# Patient Record
Sex: Male | Born: 1935 | Race: White | Hispanic: No | Marital: Married | Smoking: Current some day smoker
Health system: Southern US, Community
[De-identification: ages and names within clinical notes are randomized; demographics above are authoritative.]

## PROBLEM LIST (undated history)

## (undated) DIAGNOSIS — R9430 Abnormal result of cardiovascular function study, unspecified: Secondary | ICD-10-CM

## (undated) DIAGNOSIS — M545 Low back pain, unspecified: Secondary | ICD-10-CM

## (undated) DIAGNOSIS — IMO0002 Reserved for concepts with insufficient information to code with codable children: Secondary | ICD-10-CM

## (undated) DIAGNOSIS — R001 Bradycardia, unspecified: Secondary | ICD-10-CM

## (undated) DIAGNOSIS — I2510 Atherosclerotic heart disease of native coronary artery without angina pectoris: Secondary | ICD-10-CM

## (undated) DIAGNOSIS — E785 Hyperlipidemia, unspecified: Secondary | ICD-10-CM

## (undated) DIAGNOSIS — I10 Essential (primary) hypertension: Secondary | ICD-10-CM

## (undated) HISTORY — DX: Bradycardia, unspecified: R00.1

## (undated) HISTORY — DX: Low back pain, unspecified: M54.50

## (undated) HISTORY — PX: CATARACT EXTRACTION: SUR2

## (undated) HISTORY — DX: Low back pain: M54.5

## (undated) HISTORY — DX: Abnormal result of cardiovascular function study, unspecified: R94.30

## (undated) HISTORY — DX: Hyperlipidemia, unspecified: E78.5

## (undated) HISTORY — DX: Atherosclerotic heart disease of native coronary artery without angina pectoris: I25.10

## (undated) HISTORY — PX: CARDIAC CATHETERIZATION: SHX172

## (undated) HISTORY — DX: Reserved for concepts with insufficient information to code with codable children: IMO0002

## (undated) HISTORY — DX: Essential (primary) hypertension: I10

---

## 1997-12-24 ENCOUNTER — Inpatient Hospital Stay (HOSPITAL_COMMUNITY): Admission: EM | Admit: 1997-12-24 | Discharge: 1997-12-27 | Payer: Self-pay | Admitting: Cardiology

## 2001-06-15 ENCOUNTER — Encounter: Payer: Self-pay | Admitting: Internal Medicine

## 2001-06-15 ENCOUNTER — Ambulatory Visit (HOSPITAL_COMMUNITY): Admission: RE | Admit: 2001-06-15 | Discharge: 2001-06-15 | Payer: Self-pay | Admitting: Internal Medicine

## 2004-09-22 ENCOUNTER — Emergency Department (HOSPITAL_COMMUNITY): Admission: EM | Admit: 2004-09-22 | Discharge: 2004-09-22 | Payer: Self-pay | Admitting: Emergency Medicine

## 2004-10-03 ENCOUNTER — Inpatient Hospital Stay (HOSPITAL_COMMUNITY): Admission: RE | Admit: 2004-10-03 | Discharge: 2004-10-04 | Payer: Self-pay | Admitting: Neurosurgery

## 2005-04-11 ENCOUNTER — Ambulatory Visit (HOSPITAL_COMMUNITY): Admission: RE | Admit: 2005-04-11 | Discharge: 2005-04-11 | Payer: Self-pay | Admitting: Neurosurgery

## 2005-09-19 ENCOUNTER — Ambulatory Visit (HOSPITAL_COMMUNITY): Admission: RE | Admit: 2005-09-19 | Discharge: 2005-09-19 | Payer: Self-pay | Admitting: Neurosurgery

## 2005-09-25 ENCOUNTER — Ambulatory Visit (HOSPITAL_COMMUNITY): Admission: RE | Admit: 2005-09-25 | Discharge: 2005-09-25 | Payer: Self-pay | Admitting: Neurosurgery

## 2005-10-01 ENCOUNTER — Encounter: Admission: RE | Admit: 2005-10-01 | Discharge: 2005-10-01 | Payer: Self-pay | Admitting: Neurosurgery

## 2006-09-20 ENCOUNTER — Encounter: Admission: RE | Admit: 2006-09-20 | Discharge: 2006-09-20 | Payer: Self-pay | Admitting: Neurosurgery

## 2006-10-21 ENCOUNTER — Encounter (INDEPENDENT_AMBULATORY_CARE_PROVIDER_SITE_OTHER): Payer: Self-pay | Admitting: Specialist

## 2006-10-21 ENCOUNTER — Ambulatory Visit (HOSPITAL_COMMUNITY): Admission: RE | Admit: 2006-10-21 | Discharge: 2006-10-22 | Payer: Self-pay | Admitting: Ophthalmology

## 2007-07-10 ENCOUNTER — Ambulatory Visit (HOSPITAL_COMMUNITY): Admission: RE | Admit: 2007-07-10 | Discharge: 2007-07-10 | Payer: Self-pay | Admitting: Cardiothoracic Surgery

## 2007-07-17 ENCOUNTER — Ambulatory Visit: Payer: Self-pay | Admitting: Cardiothoracic Surgery

## 2008-06-17 ENCOUNTER — Ambulatory Visit (HOSPITAL_COMMUNITY): Admission: RE | Admit: 2008-06-17 | Discharge: 2008-06-17 | Payer: Self-pay | Admitting: Cardiothoracic Surgery

## 2009-05-08 ENCOUNTER — Inpatient Hospital Stay (HOSPITAL_COMMUNITY): Admission: EM | Admit: 2009-05-08 | Discharge: 2009-05-10 | Payer: Self-pay | Admitting: Internal Medicine

## 2009-05-08 ENCOUNTER — Ambulatory Visit: Payer: Self-pay | Admitting: Internal Medicine

## 2009-05-09 ENCOUNTER — Encounter: Payer: Self-pay | Admitting: Cardiology

## 2009-05-10 ENCOUNTER — Encounter: Payer: Self-pay | Admitting: Cardiology

## 2009-05-10 ENCOUNTER — Telehealth: Payer: Self-pay | Admitting: Cardiology

## 2009-05-15 ENCOUNTER — Telehealth (INDEPENDENT_AMBULATORY_CARE_PROVIDER_SITE_OTHER): Payer: Self-pay | Admitting: *Deleted

## 2009-05-15 ENCOUNTER — Encounter: Payer: Self-pay | Admitting: Cardiology

## 2009-05-15 ENCOUNTER — Encounter (INDEPENDENT_AMBULATORY_CARE_PROVIDER_SITE_OTHER): Payer: Self-pay | Admitting: *Deleted

## 2009-05-23 ENCOUNTER — Encounter: Payer: Self-pay | Admitting: Cardiology

## 2009-05-24 ENCOUNTER — Ambulatory Visit: Payer: Self-pay | Admitting: Cardiology

## 2009-08-02 ENCOUNTER — Telehealth: Payer: Self-pay | Admitting: Physician Assistant

## 2009-08-30 ENCOUNTER — Encounter: Payer: Self-pay | Admitting: Cardiology

## 2009-09-28 ENCOUNTER — Ambulatory Visit: Payer: Self-pay | Admitting: Cardiology

## 2009-10-20 ENCOUNTER — Ambulatory Visit: Payer: Self-pay | Admitting: Cardiology

## 2010-07-29 ENCOUNTER — Encounter: Payer: Self-pay | Admitting: Cardiothoracic Surgery

## 2010-07-30 ENCOUNTER — Encounter: Payer: Self-pay | Admitting: Cardiothoracic Surgery

## 2010-08-07 NOTE — Assessment & Plan Note (Signed)
Summary: 3 MO FU R/S   Visit Type:  Follow-up Primary Provider:  Royston Bake  CC:  CAD.  History of Present Illness: The patient is seen for followup of coronary artery disease.  He has hypertension.  He received drug-eluting stents in November, 2010.  Is not having any chest pain.  He does continue to have significant back pain.  He tells me that he has seen Dr.Nudelman of neurosurgery who had operated on his back in the past.  It seems that his pain is arthritic and not related to this disease.  I do not have the specifics.  The patient has been trying to take one of the medications but it does not appear to help.  Patient ran out of his blood pressure medicines and we will rewrite them today.  Preventive Screening-Counseling & Management  Alcohol-Tobacco     Smoking Status: quit  Comments: quit smoking about 6 months ago after smoking off/on since 76 yrs old  Current Medications (verified): 1)  Metoprolol Tartrate 50 Mg Tabs (Metoprolol Tartrate) .... Take 1 Tablet By Mouth Two Times A Day 2)  Alprazolam 0.25 Mg Tabs (Alprazolam) .... Take 1 Tablet By Mouth Four Times A Day Prn 3)  Lisinopril 40 Mg Tabs (Lisinopril) .... Take 1 Tablet By Mouth Once A Day 4)  Vicodin 5-500 Mg Tabs (Hydrocodone-Acetaminophen) .... As Needed 5)  Plavix 75 Mg Tabs (Clopidogrel Bisulfate) .... Take 1 Tablet By Mouth Once A Day 6)  Aspirin 325 Mg Tabs (Aspirin) .... Take 1 Tablet By Mouth Once A Day 7)  Crestor 40 Mg Tabs (Rosuvastatin Calcium) .... Take 1 Tablet By Mouth Once A Day (Pt Hasn't Received Yet)  Allergies: 1)  Valium  Comments:  Nurse/Medical Assistant: The patient's medications were reviewed with the patient and were updated in the Medication List. Pt verbally confirmed medications. Pt states out of BP meds. He usually gets from [place] but has had trouble and will have to get at a local pharmacy and pay for this out of pocket. Cyril Loosen, RN, BSN (September 28, 2009 2:58 PM)  Social  History: Smoking Status:  quit  Review of Systems       Patient denies fever, chills, headache, sweats, rash, change in vision, change in hearing, chest pain, cough, shortness of breath.  He has no nausea or vomiting.  There no urinary symptoms.  His low back pain is significant.  All other systems are reviewed and are negative.  Vital Signs:  Patient profile:   76 year old male Height:      68 inches Weight:      202.75 pounds Pulse rate:   68 / minute BP sitting:   209 / 88  (left arm) Cuff size:   regular  Vitals Entered By: Cyril Loosen, RN, BSN (September 28, 2009 2:52 PM) CC: CAD Comments Follow up visit. Pt states leg and back pain. He states he's having some chest tightness and has been under alot of stress.    Physical Exam  General:  patient is stable. Eyes:  no xanthelasma. Neck:  no jugular venous distention. Lungs:  lungs are clear.  Respiratory effort is nonlabored. Heart:  cardiac exam reveals S1 and S2.  No clicks or significant murmurs. Abdomen:  abdomen is soft. Extremities:  no peripheral edema. Psych:  patient is oriented to person time and place.  His affect is affected by the pain that he has in his low back.   Impression & Recommendations:  Problem # 1:  *  LOW BACK PAIN Unfortunately this back pain is bothering him a lot.  Problem # 2:  DYSLIPIDEMIA (ICD-272.4)  His updated medication list for this problem includes:    Crestor 40 Mg Tabs (Rosuvastatin calcium) .Marland Kitchen... Take 1 tablet by mouth once a day (pt hasn't received yet) Patient is on medication for his lipids.  Problem # 3:  HYPERTENSION (ICD-401.9)  His updated medication list for this problem includes:    Metoprolol Tartrate 50 Mg Tabs (Metoprolol tartrate) .Marland Kitchen... Take 1 tablet by mouth two times a day    Lisinopril 40 Mg Tabs (Lisinopril) .Marland Kitchen... Take 1 tablet by mouth once a day    Aspirin 325 Mg Tabs (Aspirin) .Marland Kitchen... Take 1 tablet by mouth once a day Blood pressure is elevated.  He has run  out of his medicines.  We will restart them.  Problem # 4:  CAD (ICD-414.00)  His updated medication list for this problem includes:    Metoprolol Tartrate 50 Mg Tabs (Metoprolol tartrate) .Marland Kitchen... Take 1 tablet by mouth two times a day    Lisinopril 40 Mg Tabs (Lisinopril) .Marland Kitchen... Take 1 tablet by mouth once a day    Plavix 75 Mg Tabs (Clopidogrel bisulfate) .Marland Kitchen... Take 1 tablet by mouth once a day    Aspirin 325 Mg Tabs (Aspirin) .Marland Kitchen... Take 1 tablet by mouth once a day Coronary disease is stable.  No further workup is needed.  All seem in 6 months.  Patient Instructions: 1)  Your physician recommends that you continue on your current medications as directed. Please refer to the Current Medication list given to you today. 2)  Your physician wants you to follow-up in: 6 months. You will receive a reminder letter in the mail about two months in advance. If you don't receive a letter, please call our office to schedule the follow-up appointment.  3)  Your request for lisinopril prescription has been sent to [facility] electronically today.  Prescriptions: LISINOPRIL 40 MG TABS (LISINOPRIL) Take 1 tablet by mouth once a day  #30 x 6   Entered by:   Carlye Grippe   Authorized by:   Talitha Givens, MD, [facility]   Signed by:   Carlye Grippe on 09/28/2009   Method used:   Electronically to        [facility] (retail)       [address]       [place], [place]  [address]       Ph: [phone]       Fax: [phone]   RxID:   [id number]

## 2010-08-07 NOTE — Progress Notes (Signed)
Summary: ?RASH,PAIN  Phone Note Call from Patient Call back at Home Phone [phone]   Caller: Patient Call For: nurse Details of Action Taken: ` Summary of Call: message left on voicemail to call. nurse returned call and patient  has been having brown spots all over stomach,sides, back and legs, neck, and head since starting plavix,rosuvastatin 40mg . patient also c/o severe pain in right lower side near lung area and thinks its coming from where the stents were put in. Nurse informed patient that he needed a PCP.  thanks, gene Initial call taken by: Carlye Grippe,  August 02, 2009 4:19 PM  Follow-up for Phone Call        Patient informed of the above.  Follow-up by: Carlye Grippe,  August 03, 2009 8:18 AM

## 2010-08-07 NOTE — Letter (Signed)
Summary: Appointment- Rescheduled  [facility] [address]   [place], [place] [address]   Phone: [phone]  Fax: [phone]     August 30, 2009 MRN: [id number]     CRESENCIO REESOR [address] [place], [place]  [address]     Dear Mr. Sotero,   Due to a change in our office schedule, your appointment on September 27, 2009                    at  2:15 pm must be changed.    Your new appointment is scheduled for March 24 at 3:45 pm.  We look forward to participating in your health care needs.   Please contact us at the number listed above at your earliest convenience to reschedule this appointment if needed.    Sincerely,  Glass blower/designer

## 2010-10-10 LAB — CARDIAC PANEL(CRET KIN+CKTOT+MB+TROPI)
CK, MB: 103.3 ng/mL — ABNORMAL HIGH (ref 0.3–4.0)
CK, MB: 70.3 ng/mL — ABNORMAL HIGH (ref 0.3–4.0)
Relative Index: 12.8 — ABNORMAL HIGH (ref 0.0–2.5)
Relative Index: 16.3 — ABNORMAL HIGH (ref 0.0–2.5)
Relative Index: 16.3 — ABNORMAL HIGH (ref 0.0–2.5)
Total CK: 548 U/L — ABNORMAL HIGH (ref 7–232)
Total CK: 634 U/L — ABNORMAL HIGH (ref 7–232)
Troponin I: 13.51 ng/mL (ref 0.00–0.06)
Troponin I: 18.27 ng/mL (ref 0.00–0.06)
Troponin I: 9.96 ng/mL (ref 0.00–0.06)

## 2010-10-10 LAB — LIPID PANEL
Cholesterol: 174 mg/dL (ref 0–200)
LDL Cholesterol: 122 mg/dL — ABNORMAL HIGH (ref 0–99)
Triglycerides: 82 mg/dL (ref <150)

## 2010-10-10 LAB — CBC
MCV: 91.3 fL (ref 78.0–100.0)
Platelets: 147 10*3/uL — ABNORMAL LOW (ref 150–400)
Platelets: 149 10*3/uL — ABNORMAL LOW (ref 150–400)
RBC: 4.07 MIL/uL — ABNORMAL LOW (ref 4.22–5.81)
WBC: 7.8 10*3/uL (ref 4.0–10.5)
WBC: 8.5 10*3/uL (ref 4.0–10.5)

## 2010-10-10 LAB — BASIC METABOLIC PANEL
BUN: 7 mg/dL (ref 6–23)
Calcium: 8.9 mg/dL (ref 8.4–10.5)
Calcium: 9 mg/dL (ref 8.4–10.5)
Chloride: 105 mEq/L (ref 96–112)
Chloride: 107 mEq/L (ref 96–112)
Creatinine, Ser: 0.97 mg/dL (ref 0.4–1.5)
Creatinine, Ser: 1.22 mg/dL (ref 0.4–1.5)
GFR calc Af Amer: >60 mL/min (ref >60)
GFR calc non Af Amer: 58 mL/min — ABNORMAL LOW (ref >60)

## 2010-10-10 LAB — HEMOGLOBIN A1C
Hgb A1c MFr Bld: 5.4 % (ref 4.6–6.1)
Mean Plasma Glucose: 108 mg/dL

## 2010-11-20 NOTE — Consult Note (Signed)
NEW PATIENT CONSULTATION   Jeffrey Clay, Jeffrey Clay  DOB:  August 22, 1934                                        July 17, 2007  CHART #:  [id number]   REASON FOR CONSULTATION:  Abnormal CT scan with evidence of a thoracic  aortic aneurysm (2 cm).   HISTORY OF PRESENT ILLNESS:  I was asked to evaluate this 76 year old  white male smoker, for evaluation and therapy of a recently-diagnosed  abnormal thoracic aorta on CT scan.  The patient has had significant  problems with upper airway secretions and a CT scan was performed at  [facility] in late December of 2008.  This showed a 2 cm bulge in  the distal aortic arch, consistent with a small aneurysm.  There is no  dissection and minimal changes of atherosclerosis in the proximal  thoracic aorta, although there is some mild calcification close to the  site of the aneurysm.  The ascending aorta measured normal.  The CT scan  also noted significant pleural thickening on the left lung and  questioned possible mesothelioma.  The patient was subsequently referred  here for evaluation and a combined PET CT scan has been completed.  This  showed pleural thickening involving the left hemithorax, predominantly  at the left lung base, with a mild FDG uptake and activity of 3.9 SUV.  There is no parenchymal lung mass or nodule noted.  There is no  significant or abnormal mediastinal adenopathy or axillary adenopathy.  No right-lung nodules or abnormalities were noted.  The patient presents  now for evaluation.   The patient denies any significant chest pain.  He has no upper back  pain, but does have lumbar pain and history of prior lumbar disc  surgery.  He states he has undergone a cardiac catheterization at [facility]  several years ago.  However, we have no record of that in our data base.   PAST MEDICAL HISTORY:  1. Hypertension.  2. Active smoking with COPD.  3. Left pleural thickening.  4. 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  5. Chronic low back pain, on narcotics.  6. Status post appendectomy and lumbar laminectomy.  7. No known drug allergies.   HOME MEDICATIONS:  1. Prilosec 20 mg a day.  2. Metoprolol 25 mg b.i.d.  3. Lisinopril 40 mg once a day.  4. Guaifenesin 200 mg q.i.d.  5. Lovastatin 40 mg a day.  6. Xanax 0.25 mg q.i.d.  7. Hydrocodone 5 t.i.d.  8. Coricidin b.i.d. p.r.n. nasal congestion.   SOCIAL HISTORY:  The patient is a retired Personnel officer.  He states he was  exposed to asbestos for  a considerable period of time in his career.  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  He does not  drink alcohol.  He smokes a half pack to one pack of cigarettes a day.  He has smoked for 50 years.   FAMILY HISTORY:  Negative for aneurysm disease of the thoracic or  abdominal aorta.  Negative for mesothelioma.   REVIEW OF SYSTEMS:  He denies weight-loss or fever.  HEENT REVIEW:  Positive for significant nasal drainage recently, improved with  Coricidin.  He denies any active dental problems or difficulty  swallowing.  He denies any history  of chest trauma.  He denies history  of significant pneumonia or abnormal prior CT scans.  It appears he has  been evaluated for asbestos exposure through a legal representative and  may have had prior x-rays, although the story is unclear from the  discussion today.  ENDOCRINE REVIEW:  Negative for diabetes or thyroid  disease.  CARDIAC REVIEW:  Significant for questionable history of a  cardiac catheterization and one episode of angina while he was moving  furniture, several years ago.  GI REVIEW:  Negative for blood per  rectum.  His PET scan did show evidence of diverticular disease of the  colon.  VASCULAR REVIEW:  Negative for TIA, claudication or DVT.  NEUROLOGIC REVIEW:  Negative for stroke or seizure.  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 Alan Mulder.  He also  states he has had some depression and anxiety over the years.   PHYSICAL EXAM:  The patient is 5 feet 9, weighs 195 pounds.  Blood  pressure is 160/88, pulse 82, respirations 18, saturation 97% on room  air.  GENERAL APPEARANCE:  That of an anxious, overweight, 76 year old male.  HEENT EXAM:  Normocephalic.  Neck is without JVD, mass or carotid bruit.  He has slight swelling of the right orbit.  LYMPHATIC EXAM:  Reveals no palpable supraclavicular or cervical  adenopathy.  Breath sounds are clear without wheeze or rhonchi.  CARDIAC EXAM:  Regular rhythm without S3, gallop or murmur.  ABDOMINAL EXAM:  Soft without pulsatile mass, slightly obese.  EXTREMITIES:  Reveal mild clubbing, but no cyanosis.  He has 1+ ankle  edema.  Peripheral pulses are intact in the extremities.  NEUROLOGIC EXAM:  Reveals him to be alert and oriented times three  without focal motor weakness.   LABORATORY DATA:  I reviewed the CT scan from [facility] in late  December and the PET CT scan done at [facility].  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  He has probably had this  for some time.  He has no history of significant trauma.  The PET scan  shows thickening of the pleura at the left lung base with a mild  increase in SUV of 3.9, consistent with inflammation.  This does not  appear to be a mesothelioma.  This, however, will need to be followed up  with a followup scan later this year.   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  He has  probably had this for several years and surgical intervention is not  indicated at this time.  I will, however, need to follow this and blood  pressure control, smoking cessation and a statin drug are the  best  treatment for this problem currently.   He also has significant left pleural thickening with mild increased SUV  activity on PET scan and this will need to be followed with a followup  PET scan in a year.   I also strongly advised the patient to stop smoking completely.  We will  arrange for him to follow up with a scan in one year of his two thoracic  issues.   Kerin Perna, M.D.  Electronically Signed   PV/MEDQ  D:  07/17/2007  T:  07/17/2007  Job:  [id number]   cc:   Dr. Lynnea Maizes [place]

## 2010-11-23 NOTE — Op Note (Signed)
NAMECORBAN, KISTLER            ACCOUNT NO.:  [id number]   MEDICAL RECORD NO.:  [id number]          PATIENT TYPE:  AMB   LOCATION:  SDS                          FACILITY:  [facility]   PHYSICIAN:  John D. Ashley Royalty, M.D. DATE OF BIRTH:  Nov 12, 1934   DATE OF PROCEDURE:  10/21/2006  DATE OF DISCHARGE:                               OPERATIVE REPORT   ADMISSION DIAGNOSIS:  Retained lens material, right eye, dislocated  intraocular lens into the vitreous, right eye.   PROCEDURES:  Pars plana vitrectomy with 25 gauge system, panretinal  photocoagulation, removal of intraocular lens from the vitreous, right  eye.   SURGEON:  Beulah Gandy. Ashley Royalty, M.D.   ASSISTANT:  Rosalie Doctor, MA   ANESTHESIA:  General.   DETAILS:  Usual prep and drape. Conjunctival peritomy from 9 o'clock to  3 o'clock. Half thickness scleral flaps were raised at 3 and 9 o'clock  in anticipation of IOL suture.  The 25 gauge trocars were placed at 8,  10 and 2 o'clock with infusion at 8 o'clock. Pars plana vitrectomy begun  just behind the pseudophakos. Lens material was encountered around the  intraocular lens and removed. The vitrectomy is carried posteriorly and  large pearly fragments were seen lying on the macular region.  An  intraocular lens was seen in the vitreous entangled in the vitreous.  This lens was carefully de-tangled from the vitreous with the vitreous  cutter.  Scleral depression was used to remove vitreous down to the  vitreous base for 360 degrees. All vitreous and all lens material was  removed.  There was an intraocular lens in place in the ciliary sulcus  and there was a second intraocular lens in place in the vitreous. At  this point, the diamond knife was used to open a corneoscleral wound  from 10 o'clock to 2 o'clock.  The intraocular lens was then passed from  the posterior segment with the 25 gauge forceps into the anterior  segment around the sulcus based intraocular lens and out through  the  corneoscleral wound.  The sulcus lens was stable and not mobile.  Additional vitrectomy was performed removing additional fragments from  around the sulcus base lens periphery and all lens material, which could  be seen, was removed.  The endolaser was positioned in the eye. 936  burns were placed around the retinal periphery with a power of 1200  milliwatts, 1000 microns each, and 0.1 seconds each.  The corneal wound  was closed with interrupted 10-0 sutures.  It was tested and found to be  tight.  The 25 gauge trocars were removed and the wounds were held until  they were sealed.  The conjunctiva was reapproximated with 7-0 chromic  suture.  Polymyxin and gentamicin were irrigated into tenon's space.  Decadron, 10 mg, was injected into the lower subconjunctival space.  Marcaine was injected around the globe for postop pain.  TobraDex  ophthalmic ointment, a patch and shield were placed.  Closing pressure  was 10 with Banker.  Complications none.  Duration one  hour.  The patient was awakened and taken to recovery in  satisfactory  condition.     Beulah Gandy. Ashley Royalty, M.D.  Electronically Signed    JDM/MEDQ  D:  10/21/2006  T:  10/21/2006  Job:  [id number]

## 2010-11-23 NOTE — Assessment & Plan Note (Signed)
[facility]                            CARDIOLOGY OFFICE NOTE   Jeffrey Clay, Jeffrey Clay                     MRN:          [id number]  DATE:06/02/2009                            DOB:          05-Jul-1935    I was contacted this evening at approximately 5:30 by Jeffrey Clay  wife.  She stated that her husband has been quite stressed over the  holidays.  His systolic blood pressure was over 200 and he did not feel  well.  She also stated that he had had chest pain for a significant  part of the day yesterday and earlier today, but presently was not  having pain.  Note, the patient had stents placed in early November by  Dr. Juanda Chance by her report and was seen in the [facility] last week.  I  explained that we could not evaluate chest pain over the phone and that  he needed evaluation since he had recent stent placed.  I recommended  that he proceed to [facility] immediately.  His wife was in  agreement with this and stated that she would try to convince him to do  that.     Madolyn Frieze Jeffrey Som, MD, [facility]  Electronically Signed    BSC/MedQ  DD: 06/02/2009  DT: 06/02/2009  Job #: [id number]

## 2010-11-23 NOTE — Op Note (Signed)
Jeffrey Clay, Jeffrey Clay            ACCOUNT NO.:  [id number]   MEDICAL RECORD NO.:  [id number]          PATIENT TYPE:  INP   LOCATION:  2899                         FACILITY:  [facility]   PHYSICIAN:  Hewitt Shorts, M.D.DATE OF BIRTH:  1935/03/01   DATE OF PROCEDURE:  10/03/2004  DATE OF DISCHARGE:                                 OPERATIVE REPORT   PREOPERATIVE DIAGNOSIS:  Left L5-S1 lumbar disk herniation, lumbar  spondylosis, lumbar degenerative disk disease and lumbar radiculopathy.   POSTOPERATIVE DIAGNOSIS:  Left L5-S1 lumbar disk herniation, lumbar  spondylosis, lumbar degenerative disk disease and lumbar radiculopathy.   OPERATION PERFORMED:  Left L5-S1 lumbar laminotomy and microdiskectomy with  microdissection.   SURGEON:  Hewitt Shorts, M.D.   ANESTHESIA:  General endotracheal.   INDICATIONS FOR PROCEDURE:  The patient is a 76 year old man who presented  with acute left lumbar radiculopathy that was found to be due to a left L5-  S1 disk herniation with a fragment that had migrated caudally behind the  body of S1 superimposed on underlying degenerative disk disease and  spondylosis.  A decision was made to proceed with elective laminotomy and  microdiskectomy.   DESCRIPTION OF PROCEDURE:  The patient was brought to the operating room and  placed under general endotracheal anesthesia.  The patient was turned to a  prone position.  Lumbar region was prepped with Betadine soap and solution  and draped in sterile fashion.  The midline was infiltrated with local  anesthetic with epinephrine.  X-ray was taken and the L5-S1 level identified  and then a midline incision was made and carried down to the subcutaneous  tissue.  Bipolar cautery and electrocautery were used to maintain  hemostasis.  Dissection was carried down to the lumbar fascia which was  incised on the left side of the midline in the paraspinal muscles were  dissected from the spinous process and lamina  in subperiosteal fashion.  An  x-ray was taken and we identified the L5-S1 intralaminar space and then the  operating microscope was draped and brought into the field to provide  additional magnification, illumination and visualization and the remainder  of the decompression was performed using microdissection and microsurgical  technique. A laminotomy was performed using the X-Max drill and Kerrison  punches.  The ligamentum flavum was carefully removed and we identified the  thecal sac and S1 and S2 nerve roots.  We found a fragment of disk located  between the S1 and S2 nerve roots and this was carefully dissected from the  nerve roots and the surrounding epidural tissues and removed in a piecemeal  fashion.  Once that fragment was removed, we were able to mobilize the  thecal sac and nerve roots medially.  The disk space was identified and the  annulus was incised.  We entered into the disk space and removed additional  degenerated disk material.  In the end, all loose fragments of disk material  were removed from both the disk space and the epidural space and good  decompression of the thecal sac and nerve roots was achieved.  Once the  diskectomy  was completed, we established hemostasis with the use of Gelfoam  soaked in thrombin.  However, all the Gelfoam was removed prior to closure.  The wound was irrigated with bacitracin solution and checked once again for  hemostasis, and then the deep fascia was closed with interrupted undyed  1Vicryl sutures, the subcutaneous and subcuticular layer were closed with  interrupted inverted 2-0 undyed Vicryl sutures and skin edges closed with  Dermabond.  The procedure was tolerated  well.  The estimated blood loss was 50 cc.  Sponge, needle and instrument  counts were correct.  Following surgery the patient was turned back to  supine position to be reversed from anesthetic, extubated and transferred to  recovery room for further care.       RWN/MEDQ  D:  10/03/2004  T:  10/03/2004  Job:  [id number]

## 2011-01-14 ENCOUNTER — Encounter: Payer: Self-pay | Admitting: Cardiology

## 2011-04-12 LAB — GLUCOSE, CAPILLARY: Glucose-Capillary: 94 mg/dL (ref 70–99)

## 2011-05-06 ENCOUNTER — Encounter: Payer: Self-pay | Admitting: Cardiology

## 2011-05-06 DIAGNOSIS — I2510 Atherosclerotic heart disease of native coronary artery without angina pectoris: Secondary | ICD-10-CM | POA: Insufficient documentation

## 2011-05-06 DIAGNOSIS — I10 Essential (primary) hypertension: Secondary | ICD-10-CM | POA: Insufficient documentation

## 2011-05-06 DIAGNOSIS — R9430 Abnormal result of cardiovascular function study, unspecified: Secondary | ICD-10-CM | POA: Insufficient documentation

## 2011-05-06 DIAGNOSIS — M545 Low back pain: Secondary | ICD-10-CM | POA: Insufficient documentation

## 2011-05-06 DIAGNOSIS — E785 Hyperlipidemia, unspecified: Secondary | ICD-10-CM | POA: Insufficient documentation

## 2011-05-07 ENCOUNTER — Ambulatory Visit (INDEPENDENT_AMBULATORY_CARE_PROVIDER_SITE_OTHER): Payer: Medicare Other | Admitting: Cardiology

## 2011-05-07 ENCOUNTER — Encounter: Payer: Self-pay | Admitting: *Deleted

## 2011-05-07 ENCOUNTER — Encounter: Payer: Self-pay | Admitting: Cardiology

## 2011-05-07 DIAGNOSIS — I2510 Atherosclerotic heart disease of native coronary artery without angina pectoris: Secondary | ICD-10-CM

## 2011-05-07 DIAGNOSIS — M545 Low back pain: Secondary | ICD-10-CM

## 2011-05-07 DIAGNOSIS — I10 Essential (primary) hypertension: Secondary | ICD-10-CM

## 2011-05-07 DIAGNOSIS — E785 Hyperlipidemia, unspecified: Secondary | ICD-10-CM

## 2011-05-07 MED ORDER — NITROGLYCERIN 0.4 MG SL SUBL: 0.4000 mg | SUBLINGUAL_TABLET | SUBLINGUAL | Status: DC | PRN

## 2011-05-07 NOTE — Assessment & Plan Note (Signed)
He has significant low back pain.  He has had surgery in the past.  It is my understanding that he is not a candidate for any other procedures and

## 2011-05-07 NOTE — Progress Notes (Signed)
HPI  patient is seen today for the evaluation of chest pain.  He has known coronary disease.  Recently while using a shovel briefly he had chest tightness.  This resolved.  He is stable today.  The patient received 2 drug-eluting stents in November, 2010.  He has a multitude of issues in his life.  His wife is ill and he offers total care.  His son has been causing problems in his home.  He has severe back pain.  As part of today's evaluation I have reviewed completely the records from the hospital ER visit in August, 2012.  He did not have a cardiac event at that time.   Allergies  Allergen Reactions  . Diazepam     REACTION: "went crazy"    Current Outpatient Prescriptions  Medication Sig Dispense Refill  . ALPRAZolam (XANAX) 0.25 MG tablet Take 0.5 mg by mouth 4 (four) times daily as needed.       Marland Kitchen aspirin 325 MG tablet Take 325 mg by mouth daily.        . furosemide (LASIX) 40 MG tablet Take 40 mg by mouth daily.        Marland Kitchen HYDROcodone-acetaminophen (VICODIN) 5-500 MG per tablet Take 1 tablet by mouth every 6 (six) hours as needed.        Marland Kitchen lisinopril (PRINIVIL,ZESTRIL) 40 MG tablet Take 40 mg by mouth daily.        . methocarbamol (ROBAXIN) 500 MG tablet Take 1,000 mg by mouth 4 (four) times daily.        . metoprolol (LOPRESSOR) 50 MG tablet Take 50 mg by mouth 2 (two) times daily.        . Omega-3 Fatty Acids (FISH OIL PO) Take by mouth daily.          History   Social History  . Marital Status: Married    Spouse Name: N/A    Number of Children: N/A  . Years of Education: N/A   Occupational History  . Not on file.   Social History Main Topics  . Smoking status: Former Games developer  . Smokeless tobacco: Not on file   Comment: has smoked 1/2 ppd for 59 years   . Alcohol Use: No  . Drug Use: No  . Sexually Active: Not on file   Other Topics Concern  . Not on file   Social History Narrative   Lives in [place], [place] with his wife. Retired Personnel officer; worked in a Medical laboratory scientific officer for  20 years. [facility] [place] ([place])Phone: [phone]: [phone]    No family history on file.  Past Medical History  Diagnosis Date  . Dyslipidemia   . HTN (hypertension)   . CAD (coronary artery disease)     Non-STEMI November, 2010, DES 2 vessels  . Low back pain     prior L5 fracture  . Ejection fraction     EF 50-55%, November, 2010, catheterization, inferior hypokinesis    Past Surgical History  Procedure Date  . Cardiac catheterization   . Cataract extraction     ROS  Patient denies fever, chills, headache, sweats, rash, change in vision, change in hearing,.  He's had a recent cough with some mild bronchitis is improving.  Is not having any GI or GU symptoms. PHYSICAL EXAM Patient is stable in the office today.  He is oriented to person time and place.  Affect is normal.  Head is atraumatic.  His mobility and sensation.  Lungs reveal a few scattered  rhonchi.  There is no respiratory distress.  Cardiac exam reveals S1-S2.  There no clicks or significant murmurs.  The abdomen is soft.  There is no peripheral edema.  There are no musculoskeletal deformities.  There no skin rashes. Filed Vitals:   05/07/11 0947  BP: 167/83  Pulse: 43  Resp: 18  Height: 5\' 8"  (1.727 m)  Weight: 197 lb (89.359 kg)    EKG is done today and reviewed by me.  There is sinus bradycardia.  There are no acute ST changes.  ASSESSMENT & PLAN

## 2011-05-07 NOTE — Assessment & Plan Note (Signed)
It is possible that his recent chest discomfort was ischemic.  He is stable today.  There is no EKG change.  I feel that we should proceed with a Lexiscan nuclear scan.  This will be arranged and also him in followup.  He'll be given nitroglycerin to carry.  I'll see him back for followup.

## 2011-05-07 NOTE — Assessment & Plan Note (Signed)
The patient had been on a statin in the past.  I would like to see if we can try to restart one.

## 2011-05-07 NOTE — Assessment & Plan Note (Signed)
Blood pressure is stable today. No change in therapy. 

## 2011-05-07 NOTE — Patient Instructions (Addendum)
Follow up as scheduled with Dr. Myrtis Ser. Your physician recommends that you continue on your current medications as directed. Please refer to the Current Medication list given to you today. Nitroglycerin-Place one tablet under tongue every 5 minutes up to 3 doses as needed for chest pain. No more than 3 doses over a 15 minute period.   Your physician has requested that you have a lexiscan cardiolite. For further information please visit [email or web address] Please follow instruction sheet, as given.

## 2011-05-13 DIAGNOSIS — R079 Chest pain, unspecified: Secondary | ICD-10-CM

## 2011-06-16 ENCOUNTER — Encounter: Payer: Self-pay | Admitting: Cardiology

## 2011-06-17 ENCOUNTER — Ambulatory Visit (INDEPENDENT_AMBULATORY_CARE_PROVIDER_SITE_OTHER): Payer: Medicare Other | Admitting: Cardiology

## 2011-06-17 ENCOUNTER — Encounter: Payer: Self-pay | Admitting: Cardiology

## 2011-06-17 DIAGNOSIS — R001 Bradycardia, unspecified: Secondary | ICD-10-CM

## 2011-06-17 DIAGNOSIS — I498 Other specified cardiac arrhythmias: Secondary | ICD-10-CM

## 2011-06-17 DIAGNOSIS — I2510 Atherosclerotic heart disease of native coronary artery without angina pectoris: Secondary | ICD-10-CM

## 2011-06-17 NOTE — Progress Notes (Signed)
HPI Patient is seen for followup of coronary disease. I saw him last May 07, 2011.He is under enormous stress at home. His wife has been ill. He did have mild chest discomfort. A nuclear stress study was done. He felt poorly with this and had diarrhea but he was stable. The study showed no evidence of ischemia. There was a small attenuation defect. Ejection fraction was 61%.    Allergies  Allergen Reactions  . Diazepam     REACTION: "went crazy"    Current Outpatient Prescriptions  Medication Sig Dispense Refill  . ALPRAZolam (XANAX) 0.5 MG tablet Take 0.5-1 mg by mouth 3 (three) times daily as needed.        Marland Kitchen amLODipine (NORVASC) 10 MG tablet Take 10 mg by mouth daily.        Marland Kitchen aspirin 325 MG tablet Take 325 mg by mouth daily.        . furosemide (LASIX) 40 MG tablet Take 40 mg by mouth daily.        Marland Kitchen HYDROcodone-acetaminophen (VICODIN) 5-500 MG per tablet Take 2 tablets by mouth every 6 (six) hours as needed.       Marland Kitchen lisinopril (PRINIVIL,ZESTRIL) 40 MG tablet Take 40 mg by mouth daily.        . methocarbamol (ROBAXIN) 500 MG tablet Take 1,000 mg by mouth 4 (four) times daily as needed.       . metoprolol (LOPRESSOR) 50 MG tablet Take 50 mg by mouth 2 (two) times daily.        . nitroGLYCERIN (NITROSTAT) 0.4 MG SL tablet Place 1 tablet (0.4 mg total) under the tongue every 5 (five) minutes as needed for chest pain.  25 tablet  3  . Omega-3 Fatty Acids (FISH OIL PO) Take 1 capsule by mouth daily.         History   Social History  . Marital Status: Married    Spouse Name: N/A    Number of Children: N/A  . Years of Education: N/A   Occupational History  . Not on file.   Social History Main Topics  . Smoking status: Former Smoker -- 0.5 packs/day for 59 years    Quit date: 07/08/1998  . Smokeless tobacco: Never Used   Comment: has smoked 1/2 ppd for 59 years   . Alcohol Use: No  . Drug Use: No  . Sexually Active: Not on file   Other Topics Concern  . Not on file    Social History Narrative   Lives in [place], [place] with his wife. Retired Personnel officer; worked in a Medical laboratory scientific officer for 20 years. [facility] [place] ([place])Phone: [phone]: [phone]    No family history on file.  Past Medical History  Diagnosis Date  . Dyslipidemia   . HTN (hypertension)   . CAD (coronary artery disease)     Non-STEMI November, 2010, DES 2 vessels  . Low back pain     prior L5 fracture  . Ejection fraction     EF 50-55%, November, 2010, catheterization, inferior hypokinesis    Past Surgical History  Procedure Date  . Cardiac catheterization   . Cataract extraction     ROS   Patient denies fever, chills, headache, sweats, rash, change in vision, change in hearing, chest pain, cough, nausea vomiting, urinary symptoms. All of the systems are reviewed and are negative.  PHYSICAL EXAM  Is stable today. He seems very tired from his work at home. There is no jugulovenous distention. Lungs are  clear. Respiratory effort is nonlabored. Cardiac exam reveals S1 and S2. There no clicks. There is a soft systolic murmur. The abdomen is soft. There is no peripheral edema.  Filed Vitals:   06/17/11 1015  BP: 157/81  Pulse: 48  Height: 5\' 8"  (1.727 m)  Weight: 196 lb (88.905 kg)     ASSESSMENT & PLAN

## 2011-06-17 NOTE — Patient Instructions (Signed)
Continue all current medications. Your physician wants you to follow up in: 6 months.  You will receive a reminder letter in the mail one-two months in advance.  If you don't receive a letter, please call our office to schedule the follow up appointment   

## 2011-06-17 NOTE — Assessment & Plan Note (Signed)
Patient's heart rate is 50. His blood pressure is still slightly elevated. He is not having any significant symptoms from bradycardia. He is already on amlodipine and an ACE inhibitor. I've chosen not to change his medications. He is not having any symptomatic bradycardia. I'll see him back for followup. If necessary, we will add a diuretic and back off his beta blocker if needed.

## 2011-06-17 NOTE — Assessment & Plan Note (Signed)
Coronary disease is stable. His nuclear study reveals no significant ischemia. No further workup.

## 2011-12-26 ENCOUNTER — Ambulatory Visit: Payer: Medicare Other | Admitting: Physician Assistant

## 2012-04-16 ENCOUNTER — Telehealth (INDEPENDENT_AMBULATORY_CARE_PROVIDER_SITE_OTHER): Payer: Self-pay | Admitting: *Deleted

## 2012-04-16 NOTE — Telephone Encounter (Signed)
Deniece Portela called and states that he is having Constipation which is has always had problem with. Now drinking the Prune Juice is not working, and he has drank Magnesium Citrate, this was about 8-10 days ago , and he did have a bowel movement. He is asking what else he may try for relief? His last Colonoscopy was 8-10 years ago ,he thinks it may have been done at [facility] by Dr.Rehman. An appointment has been given for 04-21-12 with Dorene Ar NP Dr.Rehman was paged.

## 2012-04-16 NOTE — Telephone Encounter (Signed)
Per Dr.Rehman the patient may take Miralax daily and keep his appointment with Delrae Rend on 04/21/12. Patient called and made aware. He states that his stool is black and he feels that something is in there that should not be. He also complains of pain in his rectum.

## 2012-04-21 ENCOUNTER — Ambulatory Visit (INDEPENDENT_AMBULATORY_CARE_PROVIDER_SITE_OTHER): Payer: Medicare Other | Admitting: Internal Medicine

## 2012-04-30 ENCOUNTER — Encounter (INDEPENDENT_AMBULATORY_CARE_PROVIDER_SITE_OTHER): Payer: Self-pay | Admitting: Internal Medicine

## 2012-04-30 ENCOUNTER — Ambulatory Visit (INDEPENDENT_AMBULATORY_CARE_PROVIDER_SITE_OTHER): Payer: Medicare Other | Admitting: Internal Medicine

## 2012-04-30 VITALS — BP 154/78 | HR 72 | Temp 98.0°F | Wt 191.6 lb

## 2012-04-30 DIAGNOSIS — K5909 Other constipation: Secondary | ICD-10-CM

## 2012-04-30 DIAGNOSIS — K5900 Constipation, unspecified: Secondary | ICD-10-CM

## 2012-04-30 NOTE — Progress Notes (Signed)
Subjective:     Patient ID: Jeffrey Clay, male   DOB: 1935-05-29, 77 y.o.   MRN: [id number]  HPI Here today with c/o constipation.  He was constipated for about a week. Took Mag Citrate and had a couple of black stools yesterday. He also had a black stool 2 weeks ago. This am he had a normal BM. Says he feels better. He drinks Prune Juice and correctol daily for constipation.  He also takes Miralax for his constipation. Appetite is good. He is watching what he eats. Take  ASA 325mg  daily 9/10 Colonoscopy: Normal.   Review of Systems Current Outpatient Prescriptions  Medication Sig Dispense Refill  . ALPRAZolam (XANAX) 0.5 MG tablet Take 0.5-1 mg by mouth 3 (three) times daily as needed.        Marland Kitchen amLODipine (NORVASC) 10 MG tablet Take 10 mg by mouth daily.        Marland Kitchen aspirin 325 MG tablet Take 325 mg by mouth daily.        . clopidogrel (PLAVIX) 75 MG tablet Take 75 mg by mouth daily.      . furosemide (LASIX) 40 MG tablet Take 40 mg by mouth daily.        Marland Kitchen HYDROcodone-acetaminophen (VICODIN) 5-500 MG per tablet Take 2 tablets by mouth every 6 (six) hours as needed.       Marland Kitchen lisinopril (PRINIVIL,ZESTRIL) 40 MG tablet Take 40 mg by mouth daily.        . magnesium citrate SOLN Take 1 Bottle by mouth once.      . metoprolol (LOPRESSOR) 50 MG tablet Take 50 mg by mouth 2 (two) times daily.        . nitroGLYCERIN (NITROSTAT) 0.4 MG SL tablet Place 1 tablet (0.4 mg total) under the tongue every 5 (five) minutes as needed for chest pain.  25 tablet  3  . Omega-3 Fatty Acids (FISH OIL PO) Take 1 capsule by mouth daily.       . polyethylene glycol (MIRALAX / GLYCOLAX) packet Take 17 g by mouth daily.      . methocarbamol (ROBAXIN) 500 MG tablet Take 1,000 mg by mouth 4 (four) times daily as needed.        Past Medical History  Diagnosis Date  . Dyslipidemia   . HTN (hypertension)   . CAD (coronary artery disease)     Non-STEMI November, 2010, DES 2 vessels  . Low back pain     prior L5  fracture  . Ejection fraction     EF 50-55%, November, 2010, catheterization, inferior hypokinesis  . Bradycardia    Past Surgical History  Procedure Date  . Cardiac catheterization   . Cataract extraction    No family status information on file.   History   Social History  . Marital Status: Married    Spouse Name: N/A    Number of Children: N/A  . Years of Education: N/A   Occupational History  . Not on file.   Social History Main Topics  . Smoking status: Former Smoker -- 0.5 packs/day for 59 years    Quit date: 07/08/1998  . Smokeless tobacco: Never Used   Comment: has smoked 1/2 ppd for 59 years   . Alcohol Use: No  . Drug Use: No  . Sexually Active: Not on file   Other Topics Concern  . Not on file   Social History Narrative   Lives in [place], [place] with his wife. 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 [facility] doc in [place] ([place])Phone: [phone]: [phone]   Allergies  Allergen Reactions  . Diazepam     REACTION: "went crazy"       Objective:   Physical Exam Filed Vitals:   04/30/12 1017  BP: 154/78  Pulse: 72   Filed Vitals:   04/30/12 1017  BP: 154/78  Pulse: 72  Temp: 98 F (36.7 C)  Weight: 191 lb 9.6 oz (86.909 kg)   Alert and oriented. Skin warm and dry. Oral mucosa is moist.   . Sclera anicteric, conjunctivae is pink. Thyroid not enlarged. No cervical lymphadenopathy. Lungs clear. Heart regular rate and rhythm.  Abdomen is soft. Bowel sounds are positive. No hepatomegaly. No abdominal masses felt. No tenderness.  No edema to lower extremities. Stool brown and guaiac negative.  Very tight rectal sphincter   Jeffrey Clay  04/30/2012,         Assessment:   Chronic constipation. Today he had a BM before coming in. Taking Miralax, prune juice and Mag citrate.  Gives a hx of melena but stool was negative today.    Plan:  Am going to try him on Linzess x 20 doses. He will call with a progress report  Monday.

## 2012-04-30 NOTE — Patient Instructions (Addendum)
Linzess 145 mcg daily 

## 2012-07-07 ENCOUNTER — Telehealth: Payer: Self-pay | Admitting: *Deleted

## 2012-07-07 NOTE — Telephone Encounter (Signed)
Spoke with patient and he has c/o elevated BP 180/100 today. No c/o chest pain, sob, or dizziness. Patient c/o weakness, cold chills. Patient denies fever. Patient said he is under a lot of stress and also had 2 teeth pulled recently. Patient said he took his fluid pill on yesterday and today because he wasn't able urinate but has voided since then. Patient said he is going to [place] clinic on Thursday. Patient saw PA at [facility] office this month and his BP was fine. Nurse informed patient that he needed to f/u in clinic since he was pass due and that for this issue today we could offer him nurse visit for BP check or he could see his PCP, or go to ED for evaluation.

## 2012-07-07 NOTE — Telephone Encounter (Signed)
Patient informed and given an appointment with Dr. Myrtis Ser. Patient stated he felt better after getting some sleep.

## 2012-07-07 NOTE — Telephone Encounter (Signed)
Would continue to monitor BP on present meds and schedule fu with Dr Myrtis Ser. Olga Millers

## 2012-07-23 ENCOUNTER — Telehealth (INDEPENDENT_AMBULATORY_CARE_PROVIDER_SITE_OTHER): Payer: Self-pay | Admitting: Internal Medicine

## 2012-07-23 NOTE — Telephone Encounter (Signed)
C/o ?knot near his rectum which is very tender to the touch. Also saw some blood. Has not had a BM in 4 days. Says he is constipated.   I advised him to either take an enema now or to take a Lizess. I offered him an OV for tomorrow and he will let me know.

## 2012-08-19 ENCOUNTER — Ambulatory Visit: Payer: Medicare Other | Admitting: Cardiology

## 2012-10-07 ENCOUNTER — Encounter: Payer: Self-pay | Admitting: Cardiology

## 2012-10-07 ENCOUNTER — Ambulatory Visit (INDEPENDENT_AMBULATORY_CARE_PROVIDER_SITE_OTHER): Payer: Medicare Other | Admitting: Cardiology

## 2012-10-07 VITALS — BP 171/68 | HR 43 | Ht 68.0 in | Wt 186.0 lb

## 2012-10-07 DIAGNOSIS — I10 Essential (primary) hypertension: Secondary | ICD-10-CM

## 2012-10-07 DIAGNOSIS — I2510 Atherosclerotic heart disease of native coronary artery without angina pectoris: Secondary | ICD-10-CM

## 2012-10-07 DIAGNOSIS — I498 Other specified cardiac arrhythmias: Secondary | ICD-10-CM

## 2012-10-07 DIAGNOSIS — E785 Hyperlipidemia, unspecified: Secondary | ICD-10-CM

## 2012-10-07 DIAGNOSIS — R001 Bradycardia, unspecified: Secondary | ICD-10-CM

## 2012-10-07 MED ORDER — ATORVASTATIN CALCIUM 10 MG PO TABS: 10.0000 mg | ORAL_TABLET | Freq: Every day | ORAL | Status: DC

## 2012-10-07 MED ORDER — CARVEDILOL 6.25 MG PO TABS: 6.2500 mg | ORAL_TABLET | Freq: Two times a day (BID) | ORAL | Status: DC

## 2012-10-07 NOTE — Patient Instructions (Addendum)
Your physician recommends that you schedule a follow-up appointment in: 5 weeks.  Your physician has recommended you make the following change in your medication: Start atorvastatin 10 mg daily at bedtime. Stop taking metoprolol tartrate and start carvedilol 6.25 mg by mouth twice daily. Your new prescriptions will be faxed to the [facility] pharmacy in [place]. All other medications will remain the same.

## 2012-10-07 NOTE — Progress Notes (Signed)
Patient ID: Jeffrey Clay, male   DOB: 02-Apr-1935, 78 y.o.   MRN: [id number]   HPI  Patient is seen to followup his coronary status. He stable. His wife has been ill for many years and he spends a great he'll of time trying to help her. I saw him last June 17, 2011. He's not having any chest pain. He has not had syncope or presyncope.  Allergies  Allergen Reactions  . Diazepam     REACTION: "went crazy"    Current Outpatient Prescriptions  Medication Sig Dispense Refill  . ALPRAZolam (XANAX) 0.5 MG tablet Take 0.5-1 mg by mouth 3 (three) times daily as needed.        Marland Kitchen amLODipine (NORVASC) 10 MG tablet Take 10 mg by mouth daily.        Marland Kitchen aspirin 81 MG tablet Take 81 mg by mouth daily.      . clopidogrel (PLAVIX) 75 MG tablet Take 75 mg by mouth daily.      . furosemide (LASIX) 40 MG tablet Take 40 mg by mouth as needed.       Marland Kitchen HYDROcodone-acetaminophen (VICODIN) 5-500 MG per tablet Take 2 tablets by mouth every 6 (six) hours as needed.       Marland Kitchen lisinopril (PRINIVIL,ZESTRIL) 40 MG tablet Take 40 mg by mouth daily.        . magnesium citrate SOLN Take 1 Bottle by mouth once.      . metoprolol (LOPRESSOR) 50 MG tablet Take 50 mg by mouth 2 (two) times daily.        . nitroGLYCERIN (NITROSTAT) 0.4 MG SL tablet Place 1 tablet (0.4 mg total) under the tongue every 5 (five) minutes as needed for chest pain.  25 tablet  3  . Omega-3 Fatty Acids (FISH OIL PO) Take 1 capsule by mouth every other day.       . polyethylene glycol (MIRALAX / GLYCOLAX) packet Take 17 g by mouth as needed.        No current facility-administered medications for this visit.    History   Social History  . Marital Status: Married    Spouse Name: N/A    Number of Children: N/A  . Years of Education: N/A   Occupational History  . Not on file.   Social History Main Topics  . Smoking status: Former Smoker -- 0.50 packs/day for 59 years    Quit date: 07/08/1998  . Smokeless tobacco: Never Used   Comment: has smoked 1/2 ppd for 59 years   . Alcohol Use: No  . Drug Use: No  . Sexually Active: Not on file   Other Topics Concern  . Not on file   Social History Narrative   Lives in [place], [place] with his wife.    Retired Personnel officer; worked in a Medical laboratory scientific officer for 20 years.       [facility] doc in [place] [facility] [place])   Phone: [phone]   Fax: [phone]    No family history on file.  Past Medical History  Diagnosis Date  . Dyslipidemia   . HTN (hypertension)   . CAD (coronary artery disease)     Non-STEMI November, 2010, DES 2 vessels  . Low back pain     prior L5 fracture  . Ejection fraction     EF 50-55%, November, 2010, catheterization, inferior hypokinesis  . Bradycardia     Past Surgical History  Procedure Laterality Date  . Cardiac catheterization    . Cataract extraction  Patient Active Problem List  Diagnosis  . Dyslipidemia  . HTN (hypertension)  . CAD (coronary artery disease)  . Low back pain  . Ejection fraction  . Bradycardia  . Chronic constipation    ROS   Patient denies fever, chills, headache, sweats, rash, change in vision, change in hearing, chest pain, cough, nausea vomiting, urinary symptoms. All other systems are reviewed and are negative.  PHYSICAL EXAM  Patient is oriented to person time and place. Affect is normal. There is no jugulovenous distention. Lungs are clear. Respiratory effort is nonlabored. Cardiac exam reveals S1-S2. There no clicks or significant murmurs. The abdomen is soft. Is no peripheral edema. He is walking with a cane. There are no skin rashes.  Filed Vitals:   10/07/12 1415  BP: 171/68  Pulse: 43  Height: 5\' 8"  (1.727 m)  Weight: 186 lb (84.369 kg)   EKG is done today and reviewed by me. There is sinus bradycardia. The rate is 43.  ASSESSMENT & PLAN

## 2012-10-07 NOTE — Assessment & Plan Note (Signed)
The patient's resting heart rate is low. He has not had symptoms. His systolic pressure remains slightly elevated. I'm going to switch him from metoprolol to carvedilol to see if we can have slightly better blood pressure control with slightly less heart rate lowering. Will make the switch and see him back for early followup.

## 2012-10-07 NOTE — Assessment & Plan Note (Signed)
Low dose statin is going to be started.

## 2012-10-07 NOTE — Assessment & Plan Note (Signed)
His coronary disease is stable. He does not need any further workup. He does have an elevated LDL. He is brought labs that were done on him recently elsewhere. He needs to be on a statin because of his coronary disease regardless of his LDL. I've explained this to him. I've chosen to start him on Lipitor at a dose that his lower been recommended in the guidelines. I want to start a very carefully.

## 2012-10-07 NOTE — Assessment & Plan Note (Addendum)
Systolic blood pressure continues to run high. His diastolic is only 68. I'm going to try to adjust his pressure slightly by changing the type of beta blocker. I'm hesitant to add a fourth class of medicine. He is on furosemide. I could consider changing him to  A different diuretic with a better antihypertensive profile.  As part of today's evaluation I did spend greater than 25 minutes overall with the patient. More than half of this time was spent with  With him directly reviewing all of the issues that are mentioned in the problem list. I also spoke with him at length about his wife.

## 2012-10-23 ENCOUNTER — Telehealth: Payer: Self-pay | Admitting: Cardiology

## 2012-10-23 MED ORDER — CARVEDILOL 6.25 MG PO TABS: 6.2500 mg | ORAL_TABLET | Freq: Two times a day (BID) | ORAL | Status: DC

## 2012-10-23 NOTE — Telephone Encounter (Signed)
Has a question about His Coreg

## 2012-10-29 ENCOUNTER — Other Ambulatory Visit: Payer: Self-pay | Admitting: Cardiology

## 2012-10-29 NOTE — Telephone Encounter (Signed)
Pt called questioning where his coreg 6.25 mg was. I called his Dr Eduard Clos office and they said that the medication was mailed out on Tuesday and that it should be there by Saturday. I called pt back and let him know this information.

## 2013-03-19 ENCOUNTER — Ambulatory Visit (INDEPENDENT_AMBULATORY_CARE_PROVIDER_SITE_OTHER): Payer: Medicare Other | Admitting: Cardiology

## 2013-03-19 ENCOUNTER — Encounter: Payer: Self-pay | Admitting: *Deleted

## 2013-03-19 ENCOUNTER — Encounter: Payer: Self-pay | Admitting: Cardiology

## 2013-03-19 ENCOUNTER — Telehealth: Payer: Self-pay | Admitting: Cardiology

## 2013-03-19 VITALS — BP 167/82 | HR 50 | Ht 68.0 in | Wt 175.0 lb

## 2013-03-19 DIAGNOSIS — I10 Essential (primary) hypertension: Secondary | ICD-10-CM

## 2013-03-19 DIAGNOSIS — I2510 Atherosclerotic heart disease of native coronary artery without angina pectoris: Secondary | ICD-10-CM

## 2013-03-19 DIAGNOSIS — I498 Other specified cardiac arrhythmias: Secondary | ICD-10-CM

## 2013-03-19 DIAGNOSIS — R001 Bradycardia, unspecified: Secondary | ICD-10-CM

## 2013-03-19 DIAGNOSIS — R079 Chest pain, unspecified: Secondary | ICD-10-CM

## 2013-03-19 DIAGNOSIS — R634 Abnormal weight loss: Secondary | ICD-10-CM | POA: Insufficient documentation

## 2013-03-19 MED ORDER — ISOSORBIDE MONONITRATE ER 30 MG PO TB24: 30.0000 mg | ORAL_TABLET | Freq: Every day | ORAL | Status: DC

## 2013-03-19 NOTE — Assessment & Plan Note (Signed)
Systolic blood pressure is elevated. Imdurwill be added to his medicines.

## 2013-03-19 NOTE — Progress Notes (Signed)
HPI  Patient is seen today as an add-on for chest discomfort. He's been having a lot of back pain. Today he had some chest discomfort. He took nitroglycerin and thought that he felt better after 30 minutes. He's worried that he is losing weight despite eating normally. He has a great deal of back pain. He's been helping to take care of his wife who has not been well.  Allergies  Allergen Reactions  . Diazepam     REACTION: "went crazy"    Current Outpatient Prescriptions  Medication Sig Dispense Refill  . ALPRAZolam (XANAX) 0.5 MG tablet Take 0.5-1 mg by mouth 3 (three) times daily as needed.        Marland Kitchen amLODipine (NORVASC) 10 MG tablet Take 10 mg by mouth daily.        Marland Kitchen aspirin 81 MG tablet Take 81 mg by mouth daily.      Marland Kitchen atorvastatin (LIPITOR) 10 MG tablet Take 1 tablet (10 mg total) by mouth daily.  90 tablet  3  . carvedilol (COREG) 6.25 MG tablet Take 1 tablet (6.25 mg total) by mouth 2 (two) times daily.  180 tablet  3  . clopidogrel (PLAVIX) 75 MG tablet Take 75 mg by mouth daily.      . furosemide (LASIX) 40 MG tablet Take 40 mg by mouth as needed.       Marland Kitchen HYDROcodone-acetaminophen (VICODIN) 5-500 MG per tablet Take 2 tablets by mouth every 6 (six) hours as needed.       Marland Kitchen lisinopril (PRINIVIL,ZESTRIL) 40 MG tablet Take 40 mg by mouth daily.        . magnesium citrate SOLN Take 1 Bottle by mouth as needed.       . nitroGLYCERIN (NITROSTAT) 0.4 MG SL tablet Place 0.4 mg under the tongue every 5 (five) minutes as needed for chest pain.       No current facility-administered medications for this visit.    History   Social History  . Marital Status: Married    Spouse Name: N/A    Number of Children: N/A  . Years of Education: N/A   Occupational History  . Not on file.   Social History Main Topics  . Smoking status: Former Smoker -- 0.50 packs/day for 59 years    Quit date: 07/08/1998  . Smokeless tobacco: Never Used     Comment: has smoked 1/2 ppd for 59 years   .  Alcohol Use: No  . Drug Use: No  . Sexual Activity: Not on file   Other Topics Concern  . Not on file   Social History Narrative   Lives in [place], [place] with his wife.    Retired Personnel officer; worked in a Medical laboratory scientific officer for 20 years.       [facility] doc in [place] [facility])   Phone: [phone]   Fax: [phone]    No family history on file.  Past Medical History  Diagnosis Date  . Dyslipidemia   . HTN (hypertension)   . CAD (coronary artery disease)     Non-STEMI November, 2010, DES 2 vessels  . Low back pain     prior L5 fracture  . Ejection fraction     EF 50-55%, November, 2010, catheterization, inferior hypokinesis  . Bradycardia     Past Surgical History  Procedure Laterality Date  . Cardiac catheterization    . Cataract extraction      Patient Active Problem List   Diagnosis Date Noted  . Chronic  constipation 04/30/2012  . Bradycardia   . Dyslipidemia   . HTN (hypertension)   . CAD (coronary artery disease)   . Low back pain   . Ejection fraction     ROS   Patient denies fever, chills, headache, sweats, rash, change in vision, change in hearing,, cough, nausea vomiting, urinary symptoms. All other systems are reviewed and are negative.  PHYSICAL EXAM  Patient is oriented to person time and place. Affect is normal. There is no jugulovenous distention. Lungs are clear. Respiratory effort is nonlabored. Cardiac exam reveals S1 and S2. There no clicks or significant murmurs. Abdomen is soft. There is no peripheral edema. There no musculoskeletal deformities. There are no skin rashes.  Filed Vitals:   03/19/13 1325  BP: 167/82  Pulse: 50  Height: 5\' 8"  (1.727 m)  Weight: 175 lb (79.379 kg)  SpO2: 97%   EKG is done today and reviewed by me. There is mild interventricular conduction delay that is old. There are nonspecific ST changes that are old. There is no significant change.  ASSESSMENT & PLAN

## 2013-03-19 NOTE — Assessment & Plan Note (Signed)
Patient is losing weight despite eating what he considers to be her usual diet. I've encouraged him to see Dr.Bluth for followup.

## 2013-03-19 NOTE — Assessment & Plan Note (Signed)
The patient has old asymptomatic sinus bradycardia. His rate today is 54. No change in therapy.

## 2013-03-19 NOTE — Telephone Encounter (Signed)
Patient called by Dr. Myrtis Ser and informed to come today for office evaluation.

## 2013-03-19 NOTE — Telephone Encounter (Signed)
Patient c/o intermittent chest discomfort that feels tight rated 7-8 on a scale of 1-10 (10 being the greatest). Patient took nitroglycerin 30 minutes ago and it helped. No c/o sob. Patient did c/o of dizziness during time of chest tightness. No active chest tightness during phone call. Nurse advised patient that with chest tightness rated 7-8, he needed to go to ED for evaluation. Nurse encouraged patient to go to [facility], or [facility] ED and patient said he didn't feel he needed to go to ED. Nurse informed patient that MD would be informed and nurse would call him back with response.

## 2013-03-19 NOTE — Telephone Encounter (Signed)
Patient called stating he is experiencing chest tightness.  BP is 124/66, pulse 56 (per patient).  No SOB, sometimes on hot days.  Had some dizziness earlier today.  Patient has been moving around today and feels the chest tightness.  Patient also commented he has taken Mucinex.  Patient states he fell about 3 months ago and fell on his chest.  He had a CXR.  Went to [facility].

## 2013-03-19 NOTE — Assessment & Plan Note (Addendum)
The patient has had chest discomfort today. There is no EKG change. I'm not sure yet that this represents ischemia. I will be adding Imdur 30 mg to his meds. We'll arrange for a nuclear scan in and see him back in followup.  As part of today's overall care I spent greater than 25 minutes with his care. This has included review of old records and personally speaking with him on the phone. More than half of 25 minutes was spent with direct contact with him here in the office reviewing all of his problems

## 2013-03-19 NOTE — Patient Instructions (Addendum)
Your physician recommends that you schedule a follow-up appointment in: 3-4 weeks. Your physician has recommended you make the following change in your medication: Start isosorbide mononitrate (imdur) 30 mg daily. Your new prescription has been sent to your pharmacy. All other medications will remain the same. Your physician has requested that you have a lexiscan myoview. For further information please visit [email or web address] Please follow instruction sheet, as given.

## 2013-04-07 ENCOUNTER — Encounter: Payer: Self-pay | Admitting: Cardiology

## 2013-04-08 ENCOUNTER — Ambulatory Visit: Payer: Medicare Other | Admitting: Cardiology

## 2013-04-08 ENCOUNTER — Encounter: Payer: Self-pay | Admitting: Cardiology

## 2013-04-08 ENCOUNTER — Ambulatory Visit (INDEPENDENT_AMBULATORY_CARE_PROVIDER_SITE_OTHER): Payer: Medicare Other | Admitting: Cardiology

## 2013-04-08 VITALS — BP 155/78 | HR 56 | Ht 68.0 in | Wt 173.0 lb

## 2013-04-08 DIAGNOSIS — I2510 Atherosclerotic heart disease of native coronary artery without angina pectoris: Secondary | ICD-10-CM

## 2013-04-08 NOTE — Assessment & Plan Note (Signed)
The patient's current stress test shows no obvious ischemia. I plan to keep him on a small dose of Imdur. No other workup is recommended at this time. See him back in 6 months.

## 2013-04-08 NOTE — Patient Instructions (Addendum)

## 2013-04-08 NOTE — Progress Notes (Signed)
HPI  Patient is seen today to followup chest discomfort. I saw him last March 19, 2013. We arrange for nuclear stress test. This showed normal LV function and no significant ischemia. I had put him on a small dose of Imdur. He is tolerating this. I feel he needs no further workup. He appears to be very limited by his low back pain.  Allergies  Allergen Reactions  . Diazepam     REACTION: "went crazy"    Current Outpatient Prescriptions  Medication Sig Dispense Refill  . ALPRAZolam (XANAX) 0.5 MG tablet Take 0.5-1 mg by mouth 3 (three) times daily as needed.        Marland Kitchen amLODipine (NORVASC) 10 MG tablet Take 10 mg by mouth daily.        Marland Kitchen aspirin 81 MG tablet Take 81 mg by mouth daily.      Marland Kitchen atorvastatin (LIPITOR) 10 MG tablet Take 1 tablet (10 mg total) by mouth daily.  90 tablet  3  . carvedilol (COREG) 6.25 MG tablet Take 1 tablet (6.25 mg total) by mouth 2 (two) times daily.  180 tablet  3  . clopidogrel (PLAVIX) 75 MG tablet Take 75 mg by mouth daily.      . furosemide (LASIX) 40 MG tablet Take 40 mg by mouth as needed.       Marland Kitchen HYDROcodone-acetaminophen (VICODIN) 5-500 MG per tablet Take 2 tablets by mouth every 6 (six) hours as needed.       . isosorbide mononitrate (IMDUR) 30 MG 24 hr tablet Take 1 tablet (30 mg total) by mouth daily.  90 tablet  3  . lisinopril (PRINIVIL,ZESTRIL) 40 MG tablet Take 40 mg by mouth daily.        . magnesium citrate SOLN Take 1 Bottle by mouth as needed.       . nitroGLYCERIN (NITROSTAT) 0.4 MG SL tablet Place 0.4 mg under the tongue every 5 (five) minutes as needed for chest pain.       No current facility-administered medications for this visit.    History   Social History  . Marital Status: Married    Spouse Name: N/A    Number of Children: N/A  . Years of Education: N/A   Occupational History  . Not on file.   Social History Main Topics  . Smoking status: Former Smoker -- 0.50 packs/day for 59 years    Quit date: 07/08/1998  .  Smokeless tobacco: Never Used     Comment: has smoked 1/2 ppd for 59 years   . Alcohol Use: No  . Drug Use: No  . Sexual Activity: Not on file   Other Topics Concern  . Not on file   Social History Narrative   Lives in [place], [place] with his wife.    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  Past Medical History  Diagnosis Date  . Dyslipidemia   . HTN (hypertension)   . CAD (coronary artery disease)     Non-STEMI November, 2010, DES 2 vessels  . Low back pain     prior L5 fracture  . Ejection fraction     EF 50-55%, November, 2010, catheterization, inferior hypokinesis  . Bradycardia     Past Surgical History  Procedure Laterality Date  . Cardiac catheterization    . Cataract  extraction      Patient Active Problem List   Diagnosis Date Noted  . Weight loss 03/19/2013  . Chronic constipation 04/30/2012  . Bradycardia   . Dyslipidemia   . HTN (hypertension)   . CAD (coronary artery disease)   . Low back pain   . Ejection fraction     ROS   Patient denies fever, chills, headache, sweats, rash, change in vision, change in hearing, chest pain, cough, nausea vomiting, urinary symptoms. All other systems are reviewed and are negative.  PHYSICAL EXAM  Patient stable. He's here with his daughter today. He is oriented to person time and place. Affect is normal. There is no jugulovenous distention. Lungs are clear. Respiratory effort is nonlabored. Cardiac exam reveals S1 and S2. There no clicks or significant murmurs. Abdomen is soft. There is no peripheral edema.  Filed Vitals:   04/08/13 1025  BP: 155/78  Pulse: 56  Height: 5\' 8"  (1.727 m)  Weight: 173 lb (78.472 kg)     ASSESSMENT & PLAN

## 2013-08-19 ENCOUNTER — Telehealth: Payer: Self-pay | Admitting: *Deleted

## 2013-08-19 ENCOUNTER — Encounter: Payer: Self-pay | Admitting: *Deleted

## 2013-08-19 ENCOUNTER — Emergency Department (HOSPITAL_COMMUNITY)
Admission: EM | Admit: 2013-08-19 | Discharge: 2013-08-19 | Disposition: A | Discharge status: Home / Self Care | Payer: Medicare Other | Attending: Emergency Medicine | Admitting: Emergency Medicine

## 2013-08-19 ENCOUNTER — Emergency Department (HOSPITAL_COMMUNITY): Payer: Medicare Other

## 2013-08-19 ENCOUNTER — Encounter (HOSPITAL_COMMUNITY): Payer: Self-pay | Admitting: Emergency Medicine

## 2013-08-19 ENCOUNTER — Other Ambulatory Visit: Payer: Self-pay | Admitting: *Deleted

## 2013-08-19 DIAGNOSIS — Z8781 Personal history of (healed) traumatic fracture: Secondary | ICD-10-CM | POA: Insufficient documentation

## 2013-08-19 DIAGNOSIS — I712 Thoracic aortic aneurysm, without rupture, unspecified: Secondary | ICD-10-CM | POA: Insufficient documentation

## 2013-08-19 DIAGNOSIS — I2510 Atherosclerotic heart disease of native coronary artery without angina pectoris: Secondary | ICD-10-CM | POA: Insufficient documentation

## 2013-08-19 DIAGNOSIS — E785 Hyperlipidemia, unspecified: Secondary | ICD-10-CM | POA: Insufficient documentation

## 2013-08-19 DIAGNOSIS — R222 Localized swelling, mass and lump, trunk: Secondary | ICD-10-CM | POA: Insufficient documentation

## 2013-08-19 DIAGNOSIS — R918 Other nonspecific abnormal finding of lung field: Secondary | ICD-10-CM | POA: Insufficient documentation

## 2013-08-19 DIAGNOSIS — I252 Old myocardial infarction: Secondary | ICD-10-CM | POA: Insufficient documentation

## 2013-08-19 DIAGNOSIS — Z87891 Personal history of nicotine dependence: Secondary | ICD-10-CM | POA: Insufficient documentation

## 2013-08-19 DIAGNOSIS — Z7902 Long term (current) use of antithrombotics/antiplatelets: Secondary | ICD-10-CM | POA: Insufficient documentation

## 2013-08-19 DIAGNOSIS — Z79899 Other long term (current) drug therapy: Secondary | ICD-10-CM | POA: Insufficient documentation

## 2013-08-19 DIAGNOSIS — I498 Other specified cardiac arrhythmias: Secondary | ICD-10-CM | POA: Insufficient documentation

## 2013-08-19 DIAGNOSIS — R0602 Shortness of breath: Secondary | ICD-10-CM | POA: Insufficient documentation

## 2013-08-19 DIAGNOSIS — Z9889 Other specified postprocedural states: Secondary | ICD-10-CM | POA: Insufficient documentation

## 2013-08-19 DIAGNOSIS — Z7982 Long term (current) use of aspirin: Secondary | ICD-10-CM | POA: Insufficient documentation

## 2013-08-19 DIAGNOSIS — I10 Essential (primary) hypertension: Secondary | ICD-10-CM | POA: Insufficient documentation

## 2013-08-19 LAB — CBC WITH DIFFERENTIAL/PLATELET
BASOS ABS: 0 10*3/uL (ref 0.0–0.1)
BASOS PCT: 1 % (ref 0–1)
EOS ABS: 0.2 10*3/uL (ref 0.0–0.7)
Eosinophils Relative: 3 % (ref 0–5)
HCT: 42.9 % (ref 39.0–52.0)
Hemoglobin: 13.7 g/dL (ref 13.0–17.0)
Lymphocytes Relative: 18 % (ref 12–46)
Lymphs Abs: 0.9 10*3/uL (ref 0.7–4.0)
MCH: 30 pg (ref 26.0–34.0)
MCHC: 31.9 g/dL (ref 30.0–36.0)
MCV: 94.1 fL (ref 78.0–100.0)
Monocytes Absolute: 0.4 10*3/uL (ref 0.1–1.0)
Monocytes Relative: 7 % (ref 3–12)
Neutro Abs: 3.8 10*3/uL (ref 1.7–7.7)
Neutrophils Relative %: 72 % (ref 43–77)
PLATELETS: 158 10*3/uL (ref 150–400)
RBC: 4.56 MIL/uL (ref 4.22–5.81)
RDW: 13.4 % (ref 11.5–15.5)
WBC: 5.3 10*3/uL (ref 4.0–10.5)

## 2013-08-19 LAB — BASIC METABOLIC PANEL
BUN: 14 mg/dL (ref 6–23)
CO2: 33 mEq/L — ABNORMAL HIGH (ref 19–32)
Calcium: 9.2 mg/dL (ref 8.4–10.5)
Chloride: 100 mEq/L (ref 96–112)
Creatinine, Ser: 1.01 mg/dL (ref 0.50–1.35)
GFR, EST AFRICAN AMERICAN: 81 mL/min — AB (ref >90)
GFR, EST NON AFRICAN AMERICAN: 70 mL/min — AB (ref >90)
Glucose, Bld: 101 mg/dL — ABNORMAL HIGH (ref 70–99)
Potassium: 4.1 mEq/L (ref 3.7–5.3)
Sodium: 143 mEq/L (ref 137–147)

## 2013-08-19 LAB — PRO B NATRIURETIC PEPTIDE: Pro B Natriuretic peptide (BNP): 1351 pg/mL — ABNORMAL HIGH (ref 0–450)

## 2013-08-19 LAB — LACTIC ACID, PLASMA: LACTIC ACID, VENOUS: 1.1 mmol/L (ref 0.5–2.2)

## 2013-08-19 LAB — TROPONIN I: Troponin I: <0.3 ng/mL (ref <0.30)

## 2013-08-19 MED ORDER — SODIUM CHLORIDE 0.9 % IV SOLN: INTRAVENOUS | Status: DC

## 2013-08-19 MED ORDER — IOHEXOL 350 MG/ML SOLN
100.0000 mL | Freq: Once | INTRAVENOUS | Status: AC | PRN
  Administered 2013-08-19: 100 mL via INTRAVENOUS

## 2013-08-19 MED ORDER — ALBUTEROL SULFATE (2.5 MG/3ML) 0.083% IN NEBU
2.5000 mg | INHALATION_SOLUTION | Freq: Once | RESPIRATORY_TRACT | Status: AC
  Administered 2013-08-19: 2.5 mg via RESPIRATORY_TRACT
  Filled 2013-08-19: qty 3

## 2013-08-19 MED ORDER — IPRATROPIUM-ALBUTEROL 0.5-2.5 (3) MG/3ML IN SOLN
3.0000 mL | Freq: Once | RESPIRATORY_TRACT | Status: AC
  Administered 2013-08-19: 3 mL via RESPIRATORY_TRACT
  Filled 2013-08-19: qty 3

## 2013-08-19 NOTE — ED Notes (Signed)
Complain of cough and congestion. Denies other symptoms

## 2013-08-19 NOTE — Telephone Encounter (Signed)
Tried to call. No answer.

## 2013-08-19 NOTE — ED Notes (Signed)
Pt ambulated without difficulty around nurses station, pulse ox maintained at 94% on RA.  Pt denies sob with ambulation.  nad noted.

## 2013-08-19 NOTE — Discharge Instructions (Signed)
°Emergency Department Resource Guide °1) Find a Doctor and Pay Out of Pocket °Although you won't have to find out who is covered by your insurance plan, it is a good idea to ask around and get recommendations. You will then need to call the office and see if the doctor you have chosen will accept you as a new patient and what types of options they offer for patients who are self-pay. Some doctors offer discounts or will set up payment plans for their patients who do not have insurance, but you will need to ask so you aren't surprised when you get to your appointment. ° °2) Contact Your Local Health Department °Not all health departments have doctors that can see patients for sick visits, but many do, so it is worth a call to see if yours does. If you don't know where your local health department is, you can check in your phone book. The CDC also has a tool to help you locate your state's health department, and many state websites also have listings of all of their local health departments. ° °3) Find a Walk-in Clinic °If your illness is not likely to be very severe or complicated, you may want to try a walk in clinic. These are popping up all over the country in pharmacies, drugstores, and shopping centers. They're usually staffed by nurse practitioners or physician assistants that have been trained to treat common illnesses and complaints. They're usually fairly quick and inexpensive. However, if you have serious medical issues or chronic medical problems, these are probably not your best option. ° °No Primary Care Doctor: °- Call Health Connect at  832-8000 - they can help you locate a primary care doctor that  accepts your insurance, provides certain services, etc. °- Physician Referral Service- 1-800-533-3463 ° °Chronic Pain Problems: °Organization         Address  Phone   Notes  °[facility]  (336) 297-2271 Patients need to be referred by their primary care doctor.  ° °Medication  Assistance: °Organization         Address  Phone   Notes  °[place] [facility] [address] °[place], [place] [address] (336) 641-8030 --Must be a resident of [place] °-- Must have NO insurance coverage whatsoever (no Medicaid/ Medicare, etc.) °-- The pt. MUST have a primary care doctor that directs their care regularly and follows them in the community °  °[facility]  (866) 331-1348   °[employer]  (888) 892-1162   ° °Agencies that provide inexpensive medical care: °Organization         Address  Phone   Notes  °[facility]  (336) 832-8035   °[facility]    (336) 832-7272   °[facility] [address] °[place], [place] [address] (336) 832-4777   °[facility] [address], °[place] (336) 271-4999   °[facility]    (336) 373-0678   °[facility]    (336) 272-1050   °[facility] ° [address], [place] Phone:  (336) 832-4444, Fax:  (336) 832-4440 Hours of Operation:  9 am - 6 pm, M-F.  Also accepts Medicaid/Medicare and self-pay.  °[facility] ° [address], [place] Phone: (336) [phone], Fax: (336) [phone]. Hours of Operation:  8:30 am - 5:30 pm, M-F.  Also accepts Medicaid and self-pay.  °[facility] [place] [address], [place] Phone: (336) 878-6027   °[facility] [address], [place], [place] (336)723-1848, Ext. 123 Mondays & Thursdays: 7-9 AM.  First 15 patients are seen on a first come, first serve basis. °  ° °Medicaid-accepting [place] Providers: ° °Organization         Address  Phone   Notes  °[facility] [address], [place] (336) 641-2100 Also accepts self-pay patients.  °[facility] [address], [place] ° (336) 856-9996   °[facility] [address], [place]  (336) 288-8857   °[facility] [address] [place] [address], [place] (336) 299-7000   °Veita Bland [address], [place]  ° (336) 373-1557 Only accepts [place] Access Medicaid patients after they have their name applied to their card.  ° °Self-Pay (no insurance) in [place]: ° °Organization         Address  Phone   Notes  °Sickle Cell Patients, [facility] [address], [place] (336) 832-1970   °[facility] [address], [place] (336) 832-4400   °[facility] ° [address], [place] (336) 992-4800   °[facility]/Dr. Osei-Bonsu ° [address] [place] [address], [place] or [address], [place] (336) [phone] Phone number for both [place] and [place] locations is the same.  °[facility] [address], [place] (336) 299-0000   °[facility] [address] [place] [address], [place] or [address] (336) 852-7530 °(336) 878-2260   °[facility] [address], [place] (336) 350-1642, phone; (336) 294-5005, fax Sees patients 1st and 3rd Saturday of every month.  Must not qualify for public or private insurance (i.e. Medicaid, Medicare, [place] Health Choice, Veterans' Benefits) • Household income should be no more than 200% of the poverty level •The clinic cannot treat you if you are pregnant or think you are pregnant • Sexually transmitted diseases are not treated at the clinic.  ° ° °Dental Care: °Organization         Address  Phone  Notes  °[place] [facility] [address], [place] (336) 641-6152 Accepts children up to age 21 who are enrolled in Medicaid or [place] Health Choice; pregnant women with a Medicaid card; and children who have applied for Medicaid or [place] Health Choice, but were declined, whose parents can pay a reduced fee at time of service.  °[place]  [facility] [place]  [address], [place] (336) 641-7733 Accepts children up to age 21 who are enrolled in Medicaid or [place] Health Choice; pregnant women with a Medicaid card; and children who have applied for Medicaid or  Health Choice, but were declined, whose parents can pay a reduced fee at time of service.  °[facility] ° [address],  (336) 641-4533 Patients are seen by appointment only. Walk-ins are not accepted. [facility] will see patients 18 years of age and older. °Monday - Tuesday (8am-5pm) °Most Wednesdays (8:30-5pm) °$30 per visit, cash only  °[facility] ° [address], [place] (336) 641-4533 Patients are seen by appointment only. Walk-ins are not accepted. [facility] will see patients 18 years of age and older. °One   Wednesday Evening (Monthly: Volunteer Based).  $30 per visit, cash only  °[facility]  (919) 537-3737 for adults; Children under age 4, call Graduate Pediatric Dentistry at ([phone]) [phone]. Children aged 4-14, please call (919) [phone] to request a pediatric application. ° Dental services are provided in all areas of dental care including fillings, crowns and bridges, complete and partial dentures, implants, gum treatment, root canals, and extractions. Preventive care is also provided. Treatment is provided to both adults and children. °Patients are selected via a lottery and there is often a waiting list. °  °[facility] [address], °[place] ° (336) 763-8833 [email or web address] °  °[facility] [address], [place], [place] (336)723-1848, Ext. 123 Second and Fourth Thursday of each month, opens at 6:30 AM; Clinic ends at 9 AM.  Patients are seen on a first-come first-served basis, and a limited number are seen during each clinic.  ° °[facility] ° [address], [place], [place] (336) 723-7904    Eligibility Requirements °You must have lived in [place], [place], or [place] counties for at least the last three months. °  You cannot be eligible for state or federal sponsored healthcare insurance, including Veterans Administration, Medicaid, or Medicare. °  You generally cannot be eligible for healthcare insurance through your employer.  °  How to apply: °Eligibility screenings are held every Tuesday and Wednesday afternoon from 1:00 pm until 4:00 pm. You do not need an appointment for the interview!  °[facility] [address], [place], [place] 336-631-2330   °[facility] [facility]  336-342-8273   °[place]  336-703-3100   °[facility]  336-570-6415   ° °Behavioral Health Resources in the Community: °Intensive Outpatient Programs °Organization         Address  Phone  Notes  °[facility] [address], [place], [place] 336-878-6098   °[facility] [address], [place], [place] 336-832-9800   °[facility] [address], [place], [place] ° 336-882-2125   °[facility] [address],  °[place], [place] 1-800-853-5163 or 336-641-4981   °Substance Abuse Resources °Organization         Address  Phone  Notes  °[facility]  336-882-2125   °[facility]  336-784-9470   °[facility]  336-285-9073   °[facility]  336-845-3988   °Residential & Outpatient Substance Abuse Program  1-800-659-3381   °Psychological Services °Organization         Address  Phone  Notes  °[facility]  336- 832-9600   °[facility]  336- 378-7881   °[facility] [address], [place] 1-800-853-5163 or 336-641-4981   ° °Mobile Crisis Teams °Organization         Address  Phone  Notes  °[facility], Mobile Crisis Care Unit  1-877-626-1772   °Assertive °[facility] ° [address]  [place], [place] 336-834-9664   °Sharon DeEsch [address] °[place]  336-554-5454   ° °Self-Help/Support Groups °Organization         Address  Phone             Notes  °[facility] [place] - variety of support groups  336- 373-1402 Call for more information  °Narcotics Anonymous (NA), [facility] [address], °[place]   2 meetings at this location  ° °  Residential Treatment Programs Organization         Address  Phone  Notes  [facility] [address],    [place]  1-6612006502   [facility]  [address], [place] [address], [place], [place]   [place], [place] [phone] Admissions: 8am-3pm M-F  [facility] [place] [address]    [place], [place] 239-532-0233   [facility] [address] [place], [place], [place]   [facility] [address]  [place], [place]   [facility] Intensive Outpatient [address], [place], [place]   [facility] [place]  [place], [place] 1-514-125-4282 or (763)305-8993   [facility] [address], [place], [place] Accepts Medicaid  [facility] [address]  [place] 1-(417) 704-2705 Substance Abuse/Addiction Treatment   [facility]         Address  Phone  Notes  [facility]  (479) 700-7386   Domenic Schwab, PhD [address] Arlis Porta [place], [place]   781-186-6497 or (340)518-3952   [facility] [place], [place] 217-497-4799   [facility] [address], [place], [place] 541-635-4175 Insurance/Medicaid/sponsorship through [facility] [address] [place]                                    [place], [place] [phone] [place] [address], [place] [phone]    Dr. Adele Schilder  [phone]   [facility] Dept. 1) [address], [place] 2) [place] 3)  [address], [place] ([phone]) [phone]  (519)[phone]   [place] (720)642-1120 or (303)[phone] (After Hours)      Norton Blizzard from [facility] will call you today with an appointment time in [facility] for a needle biopsy of your right lung mass. Take your usual prescriptions as previously directed.  Call the Cardiothoracic Surgery office today to schedule a follow up appointment within the next 2 days.  Return to the Emergency Department immediately sooner if worsening.

## 2013-08-19 NOTE — ED Provider Notes (Signed)
CSN: [id number]     Arrival date & time 08/19/13  1013 History   First MD Initiated Contact with Patient 08/19/13 1100     Chief Complaint  Patient presents with  . Cough      HPI Pt was seen at 1110. Per pt, c/o gradual onset and persistence of constant coughing and SOB for the past 2 days. Pt states he "had to sleep sitting up last night" because "I kept coughing so much." States he came to the ED today "to find out if I have pneumonia." Denies fevers, no CP/palpitations, no abd pain, no N/V/D, no back pain.     Past Medical History  Diagnosis Date  . Dyslipidemia   . HTN (hypertension)   . CAD (coronary artery disease)     Non-STEMI November, 2010, DES 2 vessels  . Low back pain     prior L5 fracture  . Ejection fraction     EF 50-55%, November, 2010, catheterization, inferior hypokinesis  . Bradycardia    Past Surgical History  Procedure Laterality Date  . Cardiac catheterization    . Cataract extraction      History  Substance Use Topics  . Smoking status: Former Smoker -- 0.50 packs/day for 81 years    Quit date: 07/08/1998  . Smokeless tobacco: Never Used     Comment: has smoked 1/2 ppd for 59 years   . Alcohol Use: No    Review of Systems ROS: Statement: All systems negative except as marked or noted in the HPI; Constitutional: Negative for fever and chills. ; ; Eyes: Negative for eye pain, redness and discharge. ; ; ENMT: Negative for ear pain, hoarseness, nasal congestion, sinus pressure and sore throat. ; ; Cardiovascular: Negative for chest pain, palpitations, diaphoresis, and peripheral edema. ; ; Respiratory: +cough, SOB. Negative for wheezing and stridor. ; ; Gastrointestinal: Negative for nausea, vomiting, diarrhea, abdominal pain, blood in stool, hematemesis, jaundice and rectal bleeding. . ; ; Genitourinary: Negative for dysuria, flank pain and hematuria. ; ; Musculoskeletal: Negative for back pain and neck pain. Negative for swelling and trauma.; ; Skin:  Negative for pruritus, rash, abrasions, blisters, bruising and skin lesion.; ; Neuro: Negative for headache, lightheadedness and neck stiffness. Negative for weakness, altered level of consciousness , altered mental status, extremity weakness, paresthesias, involuntary movement, seizure and syncope.     Allergies  Diazepam  Home Medications   Current Outpatient Rx  Name  Route  Sig  Dispense  Refill  . ALPRAZolam (XANAX) 0.5 MG tablet   Oral   Take 0.5-1 mg by mouth 3 (three) times daily as needed for anxiety.          Marland Kitchen amLODipine (NORVASC) 10 MG tablet   Oral   Take 10 mg by mouth daily.           Marland Kitchen aspirin 81 MG tablet   Oral   Take 81 mg by mouth daily.         Marland Kitchen atorvastatin (LIPITOR) 10 MG tablet   Oral   Take 1 tablet (10 mg total) by mouth daily.   90 tablet   3     NEW   . carvedilol (COREG) 6.25 MG tablet   Oral   Take 1 tablet (6.25 mg total) by mouth 2 (two) times daily.   180 tablet   3     D/C METOPROLOL TARTRATE 50 MG   . clopidogrel (PLAVIX) 75 MG tablet   Oral   Take 75 mg  by mouth daily.         . furosemide (LASIX) 40 MG tablet   Oral   Take 40 mg by mouth as needed for fluid.          Marland Kitchen HYDROcodone-acetaminophen (VICODIN) 5-500 MG per tablet   Oral   Take 1-2 tablets by mouth every 6 (six) hours as needed for pain.          . isosorbide mononitrate (IMDUR) 30 MG 24 hr tablet   Oral   Take 1 tablet (30 mg total) by mouth daily.   90 tablet   3     new   . lisinopril (PRINIVIL,ZESTRIL) 40 MG tablet   Oral   Take 40 mg by mouth daily.           . magnesium citrate SOLN   Oral   Take 1 Bottle by mouth as needed.          . naphazoline-glycerin (CLEAR EYES) 0.012-0.2 % SOLN   Both Eyes   Place 1 drop into both eyes daily.         . nitroGLYCERIN (NITROSTAT) 0.4 MG SL tablet   Sublingual   Place 0.4 mg under the tongue every 5 (five) minutes as needed for chest pain.         . Omega-3 Fatty Acids (FISH OIL  PO)   Oral   Take 1 capsule by mouth daily.          BP 172/80  Pulse 50  Temp(Src) 97.7 F (36.5 C) (Oral)  Resp 16  Ht 5\' 8"  (1.727 m)  Wt 172 lb (78.019 kg)  BMI 26.16 kg/m2  SpO2 93% Physical Exam 1115: Physical examination:  Nursing notes reviewed; Vital signs and O2 SAT reviewed;  Constitutional: Well developed, Well nourished, Well hydrated, In no acute distress; Head:  Normocephalic, atraumatic; Eyes: EOMI, PERRL, No scleral icterus; ENMT: Mouth and pharynx normal, Mucous membranes moist; Neck: Supple, Full range of motion, No lymphadenopathy; Cardiovascular: Regular rate and rhythm, No gallop; Respiratory: Breath sounds coarse & equal bilaterally, No wheezes.  Speaking full sentences with ease, Normal respiratory effort/excursion; Chest: Nontender, Movement normal; Abdomen: Soft, Nontender, Nondistended, Normal bowel sounds; Genitourinary: No CVA tenderness; Extremities: Pulses normal, No tenderness, No edema, No calf edema or asymmetry.; Neuro: AA&Ox3, Major CN grossly intact.  Speech clear. No gross focal motor or sensory deficits in extremities. Climbs on and off stretcher easily by himself. Gait steady.; Skin: Color normal, Warm, Dry.   ED Course  Procedures    EKG Interpretation    Date/Time:  Thursday August 19 2013 12:00:35 EST Ventricular Rate:  50 PR Interval:  220 QRS Duration: 142 QT Interval:  450 QTC Calculation: 410 R Axis:   -33 Text Interpretation:  Sinus bradycardia with 1st degree A-V block Left axis deviation Left bundle branch block Abnormal ECG When compared with ECG of 10-May-2009 08:13, Left bundle branch block is now Present Confirmed by [facility]  MD, Nunzio Cory [phone]) on 08/19/2013 3:12:06 PM            MDM  MDM Reviewed: previous chart, nursing note and vitals Reviewed previous: labs and ECG Interpretation: labs, ECG, x-ray and CT scan   Results for orders placed during the hospital encounter of 08/19/13  CBC WITH DIFFERENTIAL       Result Value Ref Range   WBC 5.3  4.0 - 10.5 K/uL   RBC 4.56  4.22 - 5.81 MIL/uL   Hemoglobin 13.7  13.0 - 17.0  g/dL   HCT 42.9  39.0 - 52.0 %   MCV 94.1  78.0 - 100.0 fL   MCH 30.0  26.0 - 34.0 pg   MCHC 31.9  30.0 - 36.0 g/dL   RDW 13.4  11.5 - 15.5 %   Platelets 158  150 - 400 K/uL   Neutrophils Relative % 72  43 - 77 %   Neutro Abs 3.8  1.7 - 7.7 K/uL   Lymphocytes Relative 18  12 - 46 %   Lymphs Abs 0.9  0.7 - 4.0 K/uL   Monocytes Relative 7  3 - 12 %   Monocytes Absolute 0.4  0.1 - 1.0 K/uL   Eosinophils Relative 3  0 - 5 %   Eosinophils Absolute 0.2  0.0 - 0.7 K/uL   Basophils Relative 1  0 - 1 %   Basophils Absolute 0.0  0.0 - 0.1 K/uL  BASIC METABOLIC PANEL      Result Value Ref Range   Sodium 143  137 - 147 mEq/L   Potassium 4.1  3.7 - 5.3 mEq/L   Chloride 100  96 - 112 mEq/L   CO2 33 (*) 19 - 32 mEq/L   Glucose, Bld 101 (*) 70 - 99 mg/dL   BUN 14  6 - 23 mg/dL   Creatinine, Ser 1.01  0.50 - 1.35 mg/dL   Calcium 9.2  8.4 - 10.5 mg/dL   GFR calc non Af Amer 70 (*) >90 mL/min   GFR calc Af Amer 81 (*) >90 mL/min  TROPONIN I      Result Value Ref Range   Troponin I <0.30  <0.30 ng/mL  LACTIC ACID, PLASMA      Result Value Ref Range   Lactic Acid, Venous 1.1  0.5 - 2.2 mmol/L  PRO B NATRIURETIC PEPTIDE      Result Value Ref Range   Pro B Natriuretic peptide (BNP) 1351.0 (*) 0 - 450 pg/mL   Dg Chest 2 View 08/19/2013   CLINICAL DATA:  Cough, congestion, history hypertension, coronary artery disease  EXAM: CHEST  2 VIEW  COMPARISON:  02/03/2013  FINDINGS: Normal heart size and pulmonary vascularity.  Tortuous thoracic aorta.  Soft tissue mass identified in the anterior lower right lung measuring 6.3 x 5.3 x 4.4 cm highly suspicious for a pulmonary neoplasm.  Underlying emphysematous and bronchitic changes consistent with COPD.  Scarring lower lateral left chest.  No definite acute infiltrate, pleural effusion or pneumothorax.  Endplate spur formation thoracic spine.   IMPRESSION: Soft tissue mass at anterior lower right lung measuring 6.3 x 5.3 x 4.4 cm in size highly suspicious for a pulmonary neoplasm ; CT chest recommended for further evaluation.  COPD changes with scarring in left hemi thorax.  Findings called to Dr. Thurnell Garbe on 08/19/2013 at 1046 hr.   Electronically Signed   By: Lavonia Dana M.D.   On: 08/19/2013 10:50   Ct Angio Chest Pe W/cm &/or Wo Cm 08/19/2013   CLINICAL DATA:  Abnormal chest radiograph with suspected right lung mass, shortness of breath, cough, history hypertension, coronary artery disease  EXAM: CT ANGIOGRAPHY CHEST WITH CONTRAST  TECHNIQUE: Multidetector CT imaging of the chest was performed using the standard protocol during bolus administration of intravenous contrast. Multiplanar CT image reconstructions and MIPs were obtained to evaluate the vascular anatomy.  CONTRAST:  184mL OMNIPAQUE IOHEXOL 350 MG/ML SOLN  COMPARISON:  Chest radiograph 08/19/2013  FINDINGS: Scattered atherosclerotic calcifications and minimal scattered thrombus within thoracic aorta.  Fusiform aneurysmal dilatation of the ascending thoracic aorta 4.5 x 4.6 cm image 47.  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  Mild fusiform aneurysm dilatation of the descending thoracic aorta at hiatus, 4.1 x 4.0 cm image 86.  No aortic dissection.  Pulmonary arteries well opacified and patent.  No evidence of pulmonary embolism.  Few scattered coronary arterial calcifications.  Small right pleural effusion.  Bibasilar atelectasis.  Lobulated soft tissue mass in anterior segment of right upper lobe, depressing the minor fissure.  Mass measures 6.2 x 4.8 x 4.9 cm and is most consistent with a primary pulmonary neoplasm.  Mass abuts the parietal pleura in the anterolateral right chest over a broad surface.  Small left upper lobe pulmonary nodule 11 x 9 mm image 24.  Questionable 8 mm right lower lobe nodule versus volume averaging  artifact image 77.  Significant thoracic adenopathy.  Enlarged precarinal lymph node 3.36 cm short axis image 39.  Enlarged right paratracheal node 3.7 cm short axis image 30.  Enlarged subcarinal node 2.6 cm short axis image 48.  Mildly enlarged right hilar node 1.6 cm short axis image 55.  AP window lymph node 1.6 cm short axis image 39.  Questionable tiny right adrenal nodule on last image.  Left retroperitoneal nodule 2.4 x 2.2 cm on last image, could potentially be of adrenal origin, adrenal gland not imaged.  No definite osseous metastases.  Review of the MIP images confirms the above findings.  IMPRESSION: No evidence of pulmonary embolism.  Fusiform aneurysmal dilatation of the ascending and descending thoracic aorta as above.  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  Additional left upper lobe and questionable right lower lobe nodules question metastatic lesions.  Extensive mediastinal and right hilar adenopathy.  Right pleural effusion.  Cannot exclude bilateral adrenal nodules.   Electronically Signed   By: Lavonia Dana M.D.   On: 08/19/2013 13:25    1350:  Pt walking around ED exam room, Sats remain 93-94% R/A during rest and ambulation. Denied SOB/CP. Dx and testing d/w pt and family.  Questions answered.  Verb understanding. Pt states he "just wants to go home and talk with my family." He is however agreeable to stay in the ED until I speak with [facility].  T/C to CTS Dr. Servando Snare, case discussed, including:  HPI, pertinent PM/SHx, VS/PE, dx testing, ED course and treatment:  He has reviewed the CT images, states TAA is not the primary concern at this point and does not need acute intervention at this time; RUL mass is likely causing his symptoms and is concerning for cancer; pt will need needle biopsy of the mass for tissue dx to further guide treatment, will also  need outpatient PET scan, MRI brain, etc. He will have [facility] call me back.   1440:  Pt continues to insist he "wants to leave right now." Has gotten himself dressed. Conversation above d/w pt and his wife. He is still insistent he "needs to leave" and "I'll go to [facility] tomorrow if they want me to." T/C from [facility], case discussed, including:  HPI, pertinent PM/SHx, VS/PE, dx testing, ED course and treatment:  Made aware [facility] does not perform needle biopsy, and she was made aware pt does not want to  be transferred to [facility] to be admitted for workup today, states she will have [facility] at [facility] call him with appointment time for biopsy (pt's phone number verified); she would also like pt to call [facility] office tomorrow to schedule f/u appointment. This conversation d/w pt and his wife. They verb understanding. Pt states "just write everything down and I'll do it tomorrow." Pt again encouraged to be admitted for further evaluation of his lung mass, likely cancer.  Pt continues to refuse. Pt d/c with outpatient f/u.     Alfonzo Feller, DO 08/21/13 1311

## 2013-08-19 NOTE — Progress Notes (Signed)
Received call regarding pt.  I called [facility] ED and spoke with the physican.  She stated that [facility] does not do CT Biopsy.  Pt. Does not want to be admitted to [facility] and wanted to go home.  I ordered CT Biopsy per Dr. Servando Snare.  I called ans spoke with Rad Dept.  They will review and schedule appt.  I called [facility] and spoke with Ebony Hail regarding pt status.

## 2013-08-23 ENCOUNTER — Telehealth: Payer: Self-pay | Admitting: *Deleted

## 2013-08-23 ENCOUNTER — Other Ambulatory Visit: Payer: Self-pay

## 2013-08-23 NOTE — Telephone Encounter (Signed)
I called pt to follow up from Thursday. He states Radiology dept has not called and set up biopsy.  I will set up for [place].  He verbalized understanding of appt time and place

## 2013-08-24 ENCOUNTER — Telehealth: Payer: Self-pay | Admitting: *Deleted

## 2013-08-24 ENCOUNTER — Encounter: Payer: Self-pay | Admitting: *Deleted

## 2013-08-24 NOTE — Telephone Encounter (Signed)
Contact daughter with all pt appointments

## 2013-08-24 NOTE — [facility] Oncology Navigator Note (Signed)
Pt daughter called today.  She stated pt gets confused about appointments.  I clarified his appointment with Dr. Julien Nordmann at [facility].  She stated she would bring pt.

## 2013-08-25 ENCOUNTER — Encounter: Payer: Self-pay | Admitting: *Deleted

## 2013-08-25 NOTE — [facility] Oncology Navigator Note (Signed)
Called Radiology dept to schedule appointment

## 2013-08-26 ENCOUNTER — Encounter: Payer: Self-pay | Admitting: Internal Medicine

## 2013-08-26 ENCOUNTER — Other Ambulatory Visit: Payer: Self-pay | Admitting: Radiology

## 2013-08-26 ENCOUNTER — Telehealth: Payer: Self-pay | Admitting: Internal Medicine

## 2013-08-26 ENCOUNTER — Encounter: Payer: Self-pay | Admitting: *Deleted

## 2013-08-26 ENCOUNTER — Encounter (HOSPITAL_COMMUNITY): Payer: Self-pay | Admitting: Pharmacy Technician

## 2013-08-26 ENCOUNTER — Ambulatory Visit: Payer: Medicare Other | Attending: Internal Medicine | Admitting: Physical Therapy

## 2013-08-26 ENCOUNTER — Ambulatory Visit (HOSPITAL_BASED_OUTPATIENT_CLINIC_OR_DEPARTMENT_OTHER): Payer: Medicare Other | Admitting: Internal Medicine

## 2013-08-26 VITALS — BP 172/76 | HR 66 | Temp 97.8°F | Resp 18 | Ht 68.0 in | Wt 175.4 lb

## 2013-08-26 DIAGNOSIS — M79609 Pain in unspecified limb: Secondary | ICD-10-CM | POA: Insufficient documentation

## 2013-08-26 DIAGNOSIS — R05 Cough: Secondary | ICD-10-CM

## 2013-08-26 DIAGNOSIS — I252 Old myocardial infarction: Secondary | ICD-10-CM | POA: Insufficient documentation

## 2013-08-26 DIAGNOSIS — Z9861 Coronary angioplasty status: Secondary | ICD-10-CM | POA: Insufficient documentation

## 2013-08-26 DIAGNOSIS — Z87891 Personal history of nicotine dependence: Secondary | ICD-10-CM | POA: Insufficient documentation

## 2013-08-26 DIAGNOSIS — R059 Cough, unspecified: Secondary | ICD-10-CM

## 2013-08-26 DIAGNOSIS — IMO0001 Reserved for inherently not codable concepts without codable children: Secondary | ICD-10-CM | POA: Insufficient documentation

## 2013-08-26 DIAGNOSIS — C3490 Malignant neoplasm of unspecified part of unspecified bronchus or lung: Secondary | ICD-10-CM

## 2013-08-26 DIAGNOSIS — R5381 Other malaise: Secondary | ICD-10-CM | POA: Insufficient documentation

## 2013-08-26 DIAGNOSIS — M549 Dorsalgia, unspecified: Secondary | ICD-10-CM | POA: Insufficient documentation

## 2013-08-26 DIAGNOSIS — F17200 Nicotine dependence, unspecified, uncomplicated: Secondary | ICD-10-CM

## 2013-08-26 DIAGNOSIS — I2510 Atherosclerotic heart disease of native coronary artery without angina pectoris: Secondary | ICD-10-CM | POA: Insufficient documentation

## 2013-08-26 DIAGNOSIS — H538 Other visual disturbances: Secondary | ICD-10-CM

## 2013-08-26 DIAGNOSIS — R222 Localized swelling, mass and lump, trunk: Secondary | ICD-10-CM | POA: Insufficient documentation

## 2013-08-26 NOTE — Progress Notes (Signed)
[place] [phone]   [phone] Multidisciplinary thoracic oncology clinic ([facility]) CONSULT NOTE  REFERRING PHYSICIAN: Dr. Francine Graven  REASON FOR CONSULTATION:  79 years old white male with questionable lung cancer.  HPI Jeffrey Clay is a 79 y.o. male with a past medical history significant for hypertension, dyslipidemia, coronary heart disease status post stent placement, low back pain as well as degenerative disc disease. The patient mentions that he has been complaining of cough and pain on the back and right shoulder for few weeks. He was seen at the emergency Department at [facility] on 08/19/2013. Chest x-ray performed at that day showed soft tissue mass at the anterior lower right lung measuring 6.3 x 5.3 x 4.4 CM in size highly suspicious for a primary neoplasm. This was followed by CT angiogram of the chest on the same day and it showed that fusiform aneurysmal dilatation of the descending thoracic aorta 4.5 x 4.6 CM and focal saccular aneurysm arising from the lateral aspect of the aortic arch, 2.5 x 2.6 x 3.0 CM and mild fusiform aneurysmal dilatation of the descending thoracic aorta at hiatus, 4.1 x 4.0 CM. The scan also showed a lobulated soft tissue mass in the anterior segment of the right upper lobe depressing the minor fissure and measured 6.2 x 4.8 x 4.9 CM most consistent with primary pulmonary neoplasm. The mass abuts the parietal pleura in the anterior lateral right chest over a broad surface. There was a small left upper lobe pulmonary nodule measuring 1.1 x 0.9 CM and questionable 0.8 CM right lower lobe nodule. There was also significant thoracic adenopathy including enlarged precarinal lymph node measuring 3.36 CM in short axis, and enlarged right paratracheal lymph node measuring 3.7 CM in short axis, an enlarged subcarinal lymph node measuring 2.6 CM in short axis, mildly enlarged right hilar node measuring 1.6 CM  short axis and AP window lymph node 1.6 CM short axis. There was also left retroperitoneal nodule measuring 2.4 x 2.2 CM that could potentially be of adrenal origin.  The patient was seen by Dr. Servando Snare for evaluation of the aneurysm and he kindly referred him to the multidisciplinary thoracic oncology clinic for evaluation and management of his questionable lung cancer. When seen today the patient continues to complain of cough productive of tan looking sputum but no hemoptysis. He was Mucinex for cough. He has been hydrocodone for pain management. He lost around 25 pounds over the last 12 months. He complains of blurry vision but no headache. He denied having any significant nausea or vomiting and no change in his bowel movement. His family history significant for father who had stomach cancer and mother had heart attack. The patient is married and has 3 living children. He was accompanied today by his wife Jeffrey Clay and his 2 daughters Jeffrey Clay and Jeffrey Clay. He is currently retired and works as a Dealer for [employer]. He has a history of smoking one pack per day for more than 50 years and unfortunately he continues to smoke. I strongly advise him to quit smoking and offered him to smoke cessation program. He has no history of alcohol or drug abuse. HPI  Past Medical History  Diagnosis Date  . Dyslipidemia   . HTN (hypertension)   . CAD (coronary artery disease)     Non-STEMI November, 2010, DES 2 vessels  . Low back pain     prior L5 fracture  . Ejection fraction  EF 50-55%, November, 2010, catheterization, inferior hypokinesis  . Bradycardia     Past Surgical History  Procedure Laterality Date  . Cardiac catheterization    . Cataract extraction      History reviewed. No pertinent family history.  Social History History  Substance Use Topics  . Smoking status: Former Smoker -- 0.50 packs/day for 69 years    Quit date: 07/08/1998  . Smokeless tobacco: Never Used      Comment: has smoked 1/2 ppd for 59 years   . Alcohol Use: No    Allergies  Allergen Reactions  . Diazepam     REACTION: "went crazy"    Current Outpatient Prescriptions  Medication Sig Dispense Refill  . ALPRAZolam (XANAX) 0.5 MG tablet Take 0.5-1 mg by mouth 3 (three) times daily as needed for anxiety.       Marland Kitchen amLODipine (NORVASC) 10 MG tablet Take 10 mg by mouth daily.        Marland Kitchen aspirin 81 MG tablet Take 81 mg by mouth daily.      . calcium-vitamin D (OSCAL WITH D) 500-200 MG-UNIT per tablet Take 1 tablet by mouth 3 (three) times a week.      . furosemide (LASIX) 40 MG tablet Take 40 mg by mouth daily.       Marland Kitchen HYDROcodone-acetaminophen (VICODIN) 5-500 MG per tablet Take 1 tablet by mouth every 8 (eight) hours as needed for pain.       Marland Kitchen lisinopril (PRINIVIL,ZESTRIL) 40 MG tablet Take 40 mg by mouth daily.        . Menthol, Topical Analgesic, (ICY HOT) 5 % PADS Apply 1 patch topically daily as needed (for back pain).      . metoprolol (LOPRESSOR) 50 MG tablet Take 50 mg by mouth 2 (two) times daily.      . nitroGLYCERIN (NITROSTAT) 0.4 MG SL tablet Place 0.4 mg under the tongue every 5 (five) minutes as needed for chest pain.      . Omega-3 Fatty Acids (FISH OIL PO) Take 1 capsule by mouth daily.      . Tetrahydrozoline HCl (VISINE OP) Apply 1 drop to eye daily as needed (for redness).       No current facility-administered medications for this visit.    Review of Systems  Constitutional: positive for anorexia, fatigue and weight loss Eyes: negative Ears, nose, mouth, throat, and face: negative Respiratory: positive for cough, dyspnea on exertion and sputum Cardiovascular: negative Gastrointestinal: negative Genitourinary:negative Integument/breast: negative Hematologic/lymphatic: negative Musculoskeletal:negative Neurological: negative Behavioral/Psych: negative Endocrine: negative Allergic/Immunologic: negative  Physical Exam  EYC:XKGYJ, healthy, no distress, well  nourished, well developed and anxious SKIN: skin color, texture, turgor are normal, no rashes or significant lesions HEAD: Normocephalic, No masses, lesions, tenderness or abnormalities EYES: normal, PERRLA EARS: External ears normal, Canals clear OROPHARYNX:no exudate, no erythema and lips, buccal mucosa, and tongue normal  NECK: supple, no adenopathy, no JVD LYMPH:  no palpable lymphadenopathy, no hepatosplenomegaly LUNGS: expiratory wheezes bilaterally HEART: regular rate & rhythm, no murmurs and no gallops ABDOMEN:abdomen soft, non-tender, normal bowel sounds and no masses or organomegaly BACK: Back symmetric, no curvature., No CVA tenderness EXTREMITIES:no joint deformities, effusion, or inflammation, no edema, no skin discoloration  NEURO: alert & oriented x 3 with fluent speech, no focal motor/sensory deficits  PERFORMANCE STATUS: ECOG 1  LABORATORY DATA: Lab Results  Component Value Date   WBC 5.3 08/19/2013   HGB 13.7 08/19/2013   HCT 42.9 08/19/2013   MCV 94.1 08/19/2013  PLT 158 08/19/2013      Chemistry      Component Value Date/Time   NA 143 08/19/2013 1137   K 4.1 08/19/2013 1137   CL 100 08/19/2013 1137   CO2 33* 08/19/2013 1137   BUN 14 08/19/2013 1137   CREATININE 1.01 08/19/2013 1137      Component Value Date/Time   CALCIUM 9.2 08/19/2013 1137       RADIOGRAPHIC STUDIES: Dg Chest 2 View  08/19/2013   CLINICAL DATA:  Cough, congestion, history hypertension, coronary artery disease  EXAM: CHEST  2 VIEW  COMPARISON:  02/03/2013  FINDINGS: Normal heart size and pulmonary vascularity.  Tortuous thoracic aorta.  Soft tissue mass identified in the anterior lower right lung measuring 6.3 x 5.3 x 4.4 cm highly suspicious for a pulmonary neoplasm.  Underlying emphysematous and bronchitic changes consistent with COPD.  Scarring lower lateral left chest.  No definite acute infiltrate, pleural effusion or pneumothorax.  Endplate spur formation thoracic spine.  IMPRESSION: Soft  tissue mass at anterior lower right lung measuring 6.3 x 5.3 x 4.4 cm in size highly suspicious for a pulmonary neoplasm ; CT chest recommended for further evaluation.  COPD changes with scarring in left hemi thorax.  Findings called to Dr. Thurnell Garbe on 08/19/2013 at 1046 hr.   Electronically Signed   By: Lavonia Dana M.D.   On: 08/19/2013 10:50   Ct Angio Chest Pe W/cm &/or Wo Cm  08/19/2013   CLINICAL DATA:  Abnormal chest radiograph with suspected right lung mass, shortness of breath, cough, history hypertension, coronary artery disease  EXAM: CT ANGIOGRAPHY CHEST WITH CONTRAST  TECHNIQUE: Multidetector CT imaging of the chest was performed using the standard protocol during bolus administration of intravenous contrast. Multiplanar CT image reconstructions and MIPs were obtained to evaluate the vascular anatomy.  CONTRAST:  158mL OMNIPAQUE IOHEXOL 350 MG/ML SOLN  COMPARISON:  Chest radiograph 08/19/2013  FINDINGS: Scattered atherosclerotic calcifications and minimal scattered thrombus within thoracic aorta.  Fusiform aneurysmal dilatation of the ascending thoracic aorta 4.5 x 4.6 cm image 47.  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  Mild fusiform aneurysm dilatation of the descending thoracic aorta at hiatus, 4.1 x 4.0 cm image 86.  No aortic dissection.  Pulmonary arteries well opacified and patent.  No evidence of pulmonary embolism.  Few scattered coronary arterial calcifications.  Small right pleural effusion.  Bibasilar atelectasis.  Lobulated soft tissue mass in anterior segment of right upper lobe, depressing the minor fissure.  Mass measures 6.2 x 4.8 x 4.9 cm and is most consistent with a primary pulmonary neoplasm.  Mass abuts the parietal pleura in the anterolateral right chest over a broad surface.  Small left upper lobe pulmonary nodule 11 x 9 mm image 24.  Questionable 8 mm right lower lobe nodule versus volume averaging artifact image 77.   Significant thoracic adenopathy.  Enlarged precarinal lymph node 3.36 cm short axis image 39.  Enlarged right paratracheal node 3.7 cm short axis image 30.  Enlarged subcarinal node 2.6 cm short axis image 48.  Mildly enlarged right hilar node 1.6 cm short axis image 55.  AP window lymph node 1.6 cm short axis image 39.  Questionable tiny right adrenal nodule on last image.  Left retroperitoneal nodule 2.4 x 2.2 cm on last image, could potentially be of adrenal origin, adrenal gland not imaged.  No definite osseous metastases.  Review of the MIP images confirms  the above findings.  IMPRESSION: No evidence of pulmonary embolism.  Fusiform aneurysmal dilatation of the ascending and descending thoracic aorta as above.  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  Additional left upper lobe and questionable right lower lobe nodules question metastatic lesions.  Extensive mediastinal and right hilar adenopathy.  Right pleural effusion.  Cannot exclude bilateral adrenal nodules.   Electronically Signed   By: Lavonia Dana M.D.   On: 08/19/2013 13:25    ASSESSMENT: This is a very pleasant 79 years old white male with highly suspicious stage IV (T2b., N2, M1b) lung cancer, pending tissue diagnosis.   PLAN: I have a lengthy discussion with the patient and his family today about his current disease status and further investigation to confirm a diagnosis. I will complete the staging workup by ordering an MRI of the brain as well as a PET scan. I will refer the patient to interventional radiology for consideration of CT-guided biopsy of the right upper lobe lung mass confirmation of the tissue diagnosis. I will see the patient back for followup visit in 2 weeks for evaluation and discussion of his treatment options based on the final staging workup and the biopsy results. For pain  management the patient will continue on hydrocodone for now. For smoke cessation, I strongly encouraged the patient to quit smoking and offered him to smoke cessation program. For his recently diagnosed aortic aneurysm, the patient will continue his current followup visit with Dr. Servando Snare He was advised to call immediately if he has any concerning symptoms in the interval. The patient voices understanding of current disease status and treatment options and is in agreement with the current care plan.  All questions were answered. The patient knows to call the clinic with any problems, questions or concerns. We can certainly see the patient much sooner if necessary.  Thank you so much for allowing me to participate in the care of Jeffrey Clay. I will continue to follow up the patient with you and assist in his care.  I spent 40 minutes counseling the patient face to face. The total time spent in the appointment was 60 minutes.  Disclaimer: This note was dictated with voice recognition software. Similar sounding words can inadvertently be transcribed and may not be corrected upon review.   Tacey Dimaggio K. 08/26/2013, 3:22 PM

## 2013-08-26 NOTE — Progress Notes (Signed)
[facility] Work  Clinical Social Work met with patient, patient's spouse, patient's daughters, and medical oncologist to offer support and assess for psychosocial needs.  MD reviewed patient's diagnosis and need for further tests before discussing treatment recommendations. Patient had no questions at this time.  CSW briefly discussed CSW role at [facility] and encouraged patient/family to call with any questions or concerns.  Polo Riley, MSW, LCSW, OSW-C Clinical Social Worker [facility] [phone]

## 2013-08-26 NOTE — Telephone Encounter (Signed)
gv and printed appt sched and avs forpt for Feb and March

## 2013-08-29 NOTE — Patient Instructions (Signed)
Followup visit in 2 weeks after her PET scan and MRI of the brain as well as biopsy of the lung mass.

## 2013-08-30 ENCOUNTER — Encounter (HOSPITAL_COMMUNITY): Payer: Self-pay

## 2013-08-30 ENCOUNTER — Ambulatory Visit (HOSPITAL_COMMUNITY)
Admission: RE | Admit: 2013-08-30 | Discharge: 2013-08-30 | Disposition: A | Discharge status: Home / Self Care | Payer: Medicare Other | Source: Ambulatory Visit | Attending: Interventional Radiology | Admitting: Interventional Radiology

## 2013-08-30 ENCOUNTER — Ambulatory Visit (HOSPITAL_COMMUNITY)
Admission: RE | Admit: 2013-08-30 | Discharge: 2013-08-30 | Disposition: A | Discharge status: Home / Self Care | Payer: Medicare Other | Source: Ambulatory Visit | Attending: Cardiothoracic Surgery | Admitting: Cardiothoracic Surgery

## 2013-08-30 ENCOUNTER — Ambulatory Visit (HOSPITAL_COMMUNITY): Admission: RE | Admit: 2013-08-30 | Payer: Medicare Other | Source: Ambulatory Visit

## 2013-08-30 VITALS — BP 159/71 | HR 58 | Temp 97.5°F | Resp 20 | Ht 68.5 in | Wt 172.0 lb

## 2013-08-30 DIAGNOSIS — E785 Hyperlipidemia, unspecified: Secondary | ICD-10-CM | POA: Insufficient documentation

## 2013-08-30 DIAGNOSIS — R918 Other nonspecific abnormal finding of lung field: Secondary | ICD-10-CM

## 2013-08-30 DIAGNOSIS — I10 Essential (primary) hypertension: Secondary | ICD-10-CM | POA: Insufficient documentation

## 2013-08-30 DIAGNOSIS — C3410 Malignant neoplasm of upper lobe, unspecified bronchus or lung: Secondary | ICD-10-CM | POA: Insufficient documentation

## 2013-08-30 DIAGNOSIS — I252 Old myocardial infarction: Secondary | ICD-10-CM | POA: Insufficient documentation

## 2013-08-30 DIAGNOSIS — F17200 Nicotine dependence, unspecified, uncomplicated: Secondary | ICD-10-CM | POA: Insufficient documentation

## 2013-08-30 DIAGNOSIS — I2510 Atherosclerotic heart disease of native coronary artery without angina pectoris: Secondary | ICD-10-CM | POA: Insufficient documentation

## 2013-08-30 LAB — CBC
HEMATOCRIT: 42.9 % (ref 39.0–52.0)
HEMOGLOBIN: 14.3 g/dL (ref 13.0–17.0)
MCH: 31.2 pg (ref 26.0–34.0)
MCHC: 33.3 g/dL (ref 30.0–36.0)
MCV: 93.7 fL (ref 78.0–100.0)
Platelets: 159 10*3/uL (ref 150–400)
RBC: 4.58 MIL/uL (ref 4.22–5.81)
RDW: 13.7 % (ref 11.5–15.5)
WBC: 6.8 10*3/uL (ref 4.0–10.5)

## 2013-08-30 LAB — PROTIME-INR
INR: 1.11 (ref 0.00–1.49)
Prothrombin Time: 14.1 seconds (ref 11.6–15.2)

## 2013-08-30 LAB — APTT: aPTT: 33 seconds (ref 24–37)

## 2013-08-30 MED ORDER — MIDAZOLAM HCL 2 MG/2ML IJ SOLN
INTRAMUSCULAR | Status: AC
  Filled 2013-08-30: qty 4

## 2013-08-30 MED ORDER — MIDAZOLAM HCL 2 MG/2ML IJ SOLN
INTRAMUSCULAR | Status: AC | PRN
  Administered 2013-08-30 (×2): 1 mg via INTRAVENOUS

## 2013-08-30 MED ORDER — FENTANYL CITRATE 0.05 MG/ML IJ SOLN
INTRAMUSCULAR | Status: AC
  Filled 2013-08-30: qty 4

## 2013-08-30 MED ORDER — FENTANYL CITRATE 0.05 MG/ML IJ SOLN
INTRAMUSCULAR | Status: AC | PRN
  Administered 2013-08-30: 25 ug via INTRAVENOUS
  Administered 2013-08-30: 50 ug via INTRAVENOUS
  Administered 2013-08-30: 25 ug via INTRAVENOUS
  Administered 2013-08-30: 50 ug via INTRAVENOUS

## 2013-08-30 MED ORDER — SODIUM CHLORIDE 0.9 % IV SOLN
Freq: Once | INTRAVENOUS | Status: AC
  Administered 2013-08-30: 10:00:00 via INTRAVENOUS

## 2013-08-30 MED ORDER — LIDOCAINE HCL 1 % IJ SOLN
INTRAMUSCULAR | Status: AC
  Filled 2013-08-30: qty 10

## 2013-08-30 NOTE — H&P (Signed)
Jeffrey Clay is an 79 y.o. male.   Chief Complaint: Pt noticed wt loss in late last yr Has stabilized at 170 lbs Developed cough/congestion/sob and had CXR 08/2013 Revealed large Rt lung mass CT performed revealed R lung mass and B lung nodules Now scheduled for Rt lung mass biopsy  HPI: HLD; HTN; CAD/NSTEMI; smoker x 59 yrs- still occasional smoker  Past Medical History  Diagnosis Date  . Dyslipidemia   . HTN (hypertension)   . CAD (coronary artery disease)     Non-STEMI November, 2010, DES 2 vessels  . Low back pain     prior L5 fracture  . Ejection fraction     EF 50-55%, November, 2010, catheterization, inferior hypokinesis  . Bradycardia     Past Surgical History  Procedure Laterality Date  . Cardiac catheterization    . Cataract extraction      History reviewed. No pertinent family history. Social History:  reports that he has been smoking.  He has never used smokeless tobacco. He reports that he does not drink alcohol or use illicit drugs.  Allergies:  Allergies  Allergen Reactions  . Diazepam     REACTION: "went crazy"     (Not in a hospital admission)  Results for orders placed during the hospital encounter of 08/30/13 (from the past 48 hour(s))  CBC     Status: None   Collection Time    08/30/13  9:47 AM      Result Value Ref Range   WBC 6.8  4.0 - 10.5 K/uL   RBC 4.58  4.22 - 5.81 MIL/uL   Hemoglobin 14.3  13.0 - 17.0 g/dL   HCT 42.9  39.0 - 52.0 %   MCV 93.7  78.0 - 100.0 fL   MCH 31.2  26.0 - 34.0 pg   MCHC 33.3  30.0 - 36.0 g/dL   RDW 13.7  11.5 - 15.5 %   Platelets 159  150 - 400 K/uL   No results found.  Review of Systems  Constitutional: Positive for weight loss. Negative for fever.  Respiratory: Positive for cough and shortness of breath.   Cardiovascular: Negative for chest pain.  Gastrointestinal: Negative for nausea, vomiting and abdominal pain.  Neurological: Positive for weakness. Negative for dizziness and headaches.   Psychiatric/Behavioral: Positive for substance abuse.       Still smokes occasionally    Blood pressure 149/68, pulse 54, temperature 97.5 F (36.4 C), temperature source Oral, resp. rate 18, height 5' 8.5" (1.74 m), weight 78.019 kg (172 lb), SpO2 98.00%. Physical Exam  Constitutional: He is oriented to person, place, and time. He appears well-developed and well-nourished.  Cardiovascular: Normal rate and regular rhythm.   No murmur heard. Respiratory: Effort normal and breath sounds normal. He has no wheezes.  GI: Soft. Bowel sounds are normal. There is no tenderness.  Musculoskeletal: Normal range of motion.  Neurological: He is alert and oriented to person, place, and time.  Skin: Skin is warm and dry.  Psychiatric: He has a normal mood and affect. His behavior is normal. Judgment and thought content normal.     Assessment/Plan Wt loss; cough/congestion x weeks/months CXR 08/2013 shows Rt l;ung mass CT revealed R lung mass and B lung nodules Now scheduled for Rt lung mass Bx Pt and family aware of procedure benefits and risks and agreeable to proceed Consent signed and in chart  Missy Baksh A 08/30/2013, 10:17 AM

## 2013-08-30 NOTE — Discharge Instructions (Signed)
Needle Biopsy of Lung, Care After Refer to this sheet in the next few weeks. These instructions provide you with information on caring for yourself after your procedure. Your health care provider may also give you more specific instructions. Your treatment has been planned according to current medical practices, but problems sometimes occur. Call your health care provider if you have any problems or questions after your procedure. WHAT TO EXPECT AFTER THE PROCEDURE A bandage will be applied over the areas where the needle was inserted. You may be asked to apply pressure to the bandage for several minutes to ensure there is minimal bleeding. In most cases, you can leave when your needle biopsy procedure is completed. Do not drive yourself home. Someone else should take you home. If you received an IV sedative or general anesthetic, you will be taken to a comfortable place to relax while the medication wears off. If you have upcoming travel scheduled, talk to your doctor about when it is safe to travel by air after the procedure. HOME CARE INSTRUCTIONS Expect to take it easy for the rest of the day. Protect the area where you received the needle biopsy by keeping the bandage in place for as long as instructed. You may feel some mild pain or discomfort in the area, but this should stop in a day or two. Only take over-the-counter or prescription medicines for pain, discomfort, or fever as directed by your caregiver. SEEK MEDICAL CARE IF:   You have pain at the biopsy site that worsens or is not helped by medication.  You have swelling or drainage at the needle biopsy site.  You have a fever. SEEK IMMEDIATE MEDICAL CARE IF:   You have new or worsening shortness of breath.  You have chest pain.  You are coughing up blood.  You have bleeding that does not stop with pressure or a bandage.  You develop light-headedness or fainting. Document Released: 04/21/2007 Document Revised: 02/24/2013 Document  Reviewed: 11/16/2012 [facility] Patient Information 2014 [place].

## 2013-08-30 NOTE — Procedures (Signed)
Interventional Radiology Procedure Note  Procedure: CT guided biopsy of RUL pulmonary mass Complications: No immediate Recommendations: - Bedrest until CXR cleared.  Minimize talking, coughing or otherwise straining.  - Follow up 2 hr CXR pending   Signed,  Criselda Peaches, MD [facility]

## 2013-08-31 ENCOUNTER — Encounter: Payer: Self-pay | Admitting: Internal Medicine

## 2013-08-31 ENCOUNTER — Telehealth: Payer: Self-pay | Admitting: *Deleted

## 2013-08-31 ENCOUNTER — Encounter: Payer: Self-pay | Admitting: *Deleted

## 2013-08-31 NOTE — Telephone Encounter (Signed)
Daughter called with appt for 09/02/13 at 3:00.  She verbalized understanding of appt time and place

## 2013-08-31 NOTE — Progress Notes (Signed)
Faxed daughter's fmla form to [employer] @ [phone]

## 2013-09-01 ENCOUNTER — Ambulatory Visit
Admission: RE | Admit: 2013-09-01 | Discharge: 2013-09-01 | Disposition: A | Discharge status: Home / Self Care | Payer: Medicare Other | Source: Ambulatory Visit | Attending: Internal Medicine | Admitting: Internal Medicine

## 2013-09-01 ENCOUNTER — Telehealth: Payer: Self-pay | Admitting: *Deleted

## 2013-09-01 DIAGNOSIS — C3490 Malignant neoplasm of unspecified part of unspecified bronchus or lung: Secondary | ICD-10-CM

## 2013-09-01 MED ORDER — GADOBENATE DIMEGLUMINE 529 MG/ML IV SOLN
16.0000 mL | Freq: Once | INTRAVENOUS | Status: AC | PRN
  Administered 2013-09-01: 16 mL via INTRAVENOUS

## 2013-09-01 NOTE — Telephone Encounter (Signed)
Called daughter changed appt time to 12:45 labs and Dr. Julien Nordmann at 1:00.  She verbalized understanding of appt.

## 2013-09-02 ENCOUNTER — Ambulatory Visit: Payer: Medicare Other | Admitting: Internal Medicine

## 2013-09-06 ENCOUNTER — Other Ambulatory Visit (HOSPITAL_BASED_OUTPATIENT_CLINIC_OR_DEPARTMENT_OTHER): Payer: Medicare Other

## 2013-09-06 ENCOUNTER — Encounter: Payer: Self-pay | Admitting: Internal Medicine

## 2013-09-06 ENCOUNTER — Ambulatory Visit (HOSPITAL_BASED_OUTPATIENT_CLINIC_OR_DEPARTMENT_OTHER): Payer: Medicare Other | Admitting: Internal Medicine

## 2013-09-06 ENCOUNTER — Encounter: Payer: Self-pay | Admitting: *Deleted

## 2013-09-06 ENCOUNTER — Telehealth: Payer: Self-pay | Admitting: Internal Medicine

## 2013-09-06 ENCOUNTER — Ambulatory Visit (HOSPITAL_COMMUNITY)
Admission: RE | Admit: 2013-09-06 | Discharge: 2013-09-06 | Disposition: A | Discharge status: Home / Self Care | Payer: Medicare Other | Source: Ambulatory Visit | Attending: Internal Medicine | Admitting: Internal Medicine

## 2013-09-06 VITALS — BP 147/72 | HR 61 | Temp 98.6°F | Resp 17 | Ht 68.5 in | Wt 170.9 lb

## 2013-09-06 DIAGNOSIS — C3490 Malignant neoplasm of unspecified part of unspecified bronchus or lung: Secondary | ICD-10-CM | POA: Diagnosis present

## 2013-09-06 DIAGNOSIS — C3491 Malignant neoplasm of unspecified part of right bronchus or lung: Secondary | ICD-10-CM | POA: Insufficient documentation

## 2013-09-06 DIAGNOSIS — C3410 Malignant neoplasm of upper lobe, unspecified bronchus or lung: Secondary | ICD-10-CM

## 2013-09-06 DIAGNOSIS — C770 Secondary and unspecified malignant neoplasm of lymph nodes of head, face and neck: Secondary | ICD-10-CM | POA: Insufficient documentation

## 2013-09-06 DIAGNOSIS — R222 Localized swelling, mass and lump, trunk: Secondary | ICD-10-CM | POA: Insufficient documentation

## 2013-09-06 DIAGNOSIS — C771 Secondary and unspecified malignant neoplasm of intrathoracic lymph nodes: Secondary | ICD-10-CM | POA: Diagnosis not present

## 2013-09-06 LAB — COMPREHENSIVE METABOLIC PANEL (CC13)
ALBUMIN: 3.8 g/dL (ref 3.5–5.0)
ALT: 11 U/L (ref 0–55)
ANION GAP: 5 meq/L (ref 3–11)
AST: 18 U/L (ref 5–34)
Alkaline Phosphatase: 76 U/L (ref 40–150)
BUN: 19.5 mg/dL (ref 7.0–26.0)
CO2: 37 mEq/L — ABNORMAL HIGH (ref 22–29)
Calcium: 9.7 mg/dL (ref 8.4–10.4)
Chloride: 104 mEq/L (ref 98–109)
Creatinine: 1.1 mg/dL (ref 0.7–1.3)
Glucose: 105 mg/dl (ref 70–140)
Potassium: 4 mEq/L (ref 3.5–5.1)
Sodium: 146 mEq/L — ABNORMAL HIGH (ref 136–145)
TOTAL PROTEIN: 8.2 g/dL (ref 6.4–8.3)
Total Bilirubin: 0.58 mg/dL (ref 0.20–1.20)

## 2013-09-06 LAB — CBC WITH DIFFERENTIAL/PLATELET
BASO%: 0.6 % (ref 0.0–2.0)
BASOS ABS: 0 10*3/uL (ref 0.0–0.1)
EOS%: 2.4 % (ref 0.0–7.0)
Eosinophils Absolute: 0.2 10*3/uL (ref 0.0–0.5)
HEMATOCRIT: 44.3 % (ref 38.4–49.9)
HEMOGLOBIN: 14.3 g/dL (ref 13.0–17.1)
LYMPH%: 15.8 % (ref 14.0–49.0)
MCH: 29.4 pg (ref 27.2–33.4)
MCHC: 32.3 g/dL (ref 32.0–36.0)
MCV: 91 fL (ref 79.3–98.0)
MONO#: 0.5 10*3/uL (ref 0.1–0.9)
MONO%: 7.5 % (ref 0.0–14.0)
NEUT#: 4.9 10*3/uL (ref 1.5–6.5)
NEUT%: 73.7 % (ref 39.0–75.0)
Platelets: 213 10*3/uL (ref 140–400)
RBC: 4.86 10*6/uL (ref 4.20–5.82)
RDW: 13.6 % (ref 11.0–14.6)
WBC: 6.6 10*3/uL (ref 4.0–10.3)
lymph#: 1 10*3/uL (ref 0.9–3.3)

## 2013-09-06 LAB — GLUCOSE, CAPILLARY: Glucose-Capillary: 99 mg/dL (ref 70–99)

## 2013-09-06 MED ORDER — PROCHLORPERAZINE MALEATE 10 MG PO TABS: 10.0000 mg | ORAL_TABLET | Freq: Four times a day (QID) | ORAL | Status: AC | PRN

## 2013-09-06 MED ORDER — FLUDEOXYGLUCOSE F - 18 (FDG) INJECTION
8.0000 | Freq: Once | INTRAVENOUS | Status: AC | PRN
  Administered 2013-09-06: 8 via INTRAVENOUS

## 2013-09-06 NOTE — Telephone Encounter (Signed)
Gave pt appt for lab, md, injection and chemo for March , emailed [facility] regarding chemo

## 2013-09-06 NOTE — Progress Notes (Signed)
[place] [phone]   [phone]) [phone]  OFFICE PROGRESS NOTE  Celedonio Savage, MD [address] [place] [address]  DIAGNOSIS: Extensive stage, stage IV (T2b, N3, M1b) small cell lung cancer diagnosed in February of 2015.  PRIOR THERAPY: None  CURRENT THERAPY: Systemic chemotherapy according to the ECoG E. 2511 protocol with cisplatin and etoposide plus/minus Veliparib. First dose 09/13/2013  INTERVAL HISTORY: Jeffrey Clay 79 y.o. male returns to the clinic today for followup visit accompanied by his wife as well as his 2 daughters [name] and [name]. The patient is feeling fine today with no specific complaints except for the fatigue and mild dry cough. He underwent several studies since his last visit including CT guided core biopsy of the right upper lobe lung mass in addition to a PET scan as well as MRI of the brain. He is here today for evaluation and discussion of his biopsy and the scan results as well as treatment options. He denied having any significant weight loss or night sweats, no nausea or vomiting, no fever or chills. He has no chest pain but continues to have shortness of breath with exertion and cough with no hemoptysis.  MEDICAL HISTORY: Past Medical History  Diagnosis Date  . Dyslipidemia   . HTN (hypertension)   . CAD (coronary artery disease)     Non-STEMI November, 2010, DES 2 vessels  . Low back pain     prior L5 fracture  . Ejection fraction     EF 50-55%, November, 2010, catheterization, inferior hypokinesis  . Bradycardia     ALLERGIES:  is allergic to diazepam.  MEDICATIONS:  Current Outpatient Prescriptions  Medication Sig Dispense Refill  . ALPRAZolam (XANAX) 0.5 MG tablet Take 0.5-1 mg by mouth 3 (three) times daily as needed for anxiety.       Marland Kitchen amLODipine (NORVASC) 10 MG tablet Take 10 mg by mouth daily.        Marland Kitchen aspirin 81 MG tablet Take 81 mg by mouth daily.      . calcium-vitamin D (OSCAL WITH  D) 500-200 MG-UNIT per tablet Take 1 tablet by mouth 3 (three) times a week.      . cycloSPORINE (RESTASIS) 0.05 % ophthalmic emulsion 1 drop 2 (two) times daily.      . furosemide (LASIX) 40 MG tablet Take 40 mg by mouth daily.       Marland Kitchen lisinopril (PRINIVIL,ZESTRIL) 40 MG tablet Take 40 mg by mouth daily.        . Menthol, Topical Analgesic, (ICY HOT) 5 % PADS Apply 1 patch topically daily as needed (for back pain).      . metoprolol (LOPRESSOR) 50 MG tablet Take 50 mg by mouth 2 (two) times daily.      . Omega-3 Fatty Acids (FISH OIL PO) Take 1 capsule by mouth daily.      Marland Kitchen HYDROcodone-acetaminophen (VICODIN) 5-500 MG per tablet Take 1 tablet by mouth every 8 (eight) hours as needed for pain.       . nitroGLYCERIN (NITROSTAT) 0.4 MG SL tablet Place 0.4 mg under the tongue every 5 (five) minutes as needed for chest pain.      Marland Kitchen prochlorperazine (COMPAZINE) 10 MG tablet Take 1 tablet (10 mg total) by mouth every 6 (six) hours as needed for nausea or vomiting.  60 tablet  0   No current facility-administered medications for this visit.    SURGICAL HISTORY:  Past Surgical History  Procedure Laterality Date  . Cardiac catheterization    . Cataract extraction      REVIEW OF SYSTEMS:  Constitutional: positive for fatigue Eyes: negative Ears, nose, mouth, throat, and face: negative Respiratory: positive for cough and dyspnea on exertion Cardiovascular: negative Gastrointestinal: negative Genitourinary:negative Integument/breast: negative Hematologic/lymphatic: negative Musculoskeletal:negative Neurological: negative Behavioral/Psych: negative Endocrine: negative Allergic/Immunologic: negative   PHYSICAL EXAMINATION: General appearance: alert, cooperative and no distress Head: Normocephalic, without obvious abnormality, atraumatic Neck: no adenopathy, no JVD, supple, symmetrical, trachea midline and thyroid not enlarged, symmetric, no tenderness/mass/nodules Lymph nodes: Cervical,  supraclavicular, and axillary nodes normal. Resp: clear to auscultation bilaterally Back: symmetric, no curvature. ROM normal. No CVA tenderness. Cardio: regular rate and rhythm, S1, S2 normal, no murmur, click, rub or gallop GI: soft, non-tender; bowel sounds normal; no masses,  no organomegaly Extremities: extremities normal, atraumatic, no cyanosis or edema Neurologic: Alert and oriented X 3, normal strength and tone. Normal symmetric reflexes. Normal coordination and gait  ECOG PERFORMANCE STATUS: 1 - Symptomatic but completely ambulatory  Blood pressure 147/72, pulse 61, temperature 98.6 F (37 C), temperature source Oral, resp. rate 17, height 5' 8.5" (1.74 m), weight 170 lb 14.4 oz (77.52 kg), SpO2 88.00%.  LABORATORY DATA: Lab Results  Component Value Date   WBC 6.6 09/06/2013   HGB 14.3 09/06/2013   HCT 44.3 09/06/2013   MCV 91.0 09/06/2013   PLT 213 09/06/2013      Chemistry      Component Value Date/Time   NA 146* 09/06/2013 1204   NA 143 08/19/2013 1137   K 4.0 09/06/2013 1204   K 4.1 08/19/2013 1137   CL 100 08/19/2013 1137   CO2 37* 09/06/2013 1204   CO2 33* 08/19/2013 1137   BUN 19.5 09/06/2013 1204   BUN 14 08/19/2013 1137   CREATININE 1.1 09/06/2013 1204   CREATININE 1.01 08/19/2013 1137      Component Value Date/Time   CALCIUM 9.7 09/06/2013 1204   CALCIUM 9.2 08/19/2013 1137   ALKPHOS 76 09/06/2013 1204   AST 18 09/06/2013 1204   ALT 11 09/06/2013 1204   BILITOT 0.58 09/06/2013 1204       RADIOGRAPHIC STUDIES: Dg Chest 2 View  08/19/2013   CLINICAL DATA:  Cough, congestion, history hypertension, coronary artery disease  EXAM: CHEST  2 VIEW  COMPARISON:  02/03/2013  FINDINGS: Normal heart size and pulmonary vascularity.  Tortuous thoracic aorta.  Soft tissue mass identified in the anterior lower right lung measuring 6.3 x 5.3 x 4.4 cm highly suspicious for a pulmonary neoplasm.  Underlying emphysematous and bronchitic changes consistent with COPD.  Scarring lower lateral left chest.   No definite acute infiltrate, pleural effusion or pneumothorax.  Endplate spur formation thoracic spine.  IMPRESSION: Soft tissue mass at anterior lower right lung measuring 6.3 x 5.3 x 4.4 cm in size highly suspicious for a pulmonary neoplasm ; CT chest recommended for further evaluation.  COPD changes with scarring in left hemi thorax.  Findings called to Dr. Thurnell Garbe on 08/19/2013 at 1046 hr.   Electronically Signed   By: Lavonia Dana M.D.   On: 08/19/2013 10:50   Ct Angio Chest Pe W/cm &/or Wo Cm  08/19/2013   CLINICAL DATA:  Abnormal chest radiograph with suspected right lung mass, shortness of breath, cough, history hypertension, coronary artery disease  EXAM: CT ANGIOGRAPHY CHEST WITH CONTRAST  TECHNIQUE: Multidetector CT imaging of the chest was performed using the standard protocol during bolus administration of intravenous contrast. Multiplanar CT  image reconstructions and MIPs were obtained to evaluate the vascular anatomy.  CONTRAST:  117mL OMNIPAQUE IOHEXOL 350 MG/ML SOLN  COMPARISON:  Chest radiograph 08/19/2013  FINDINGS: Scattered atherosclerotic calcifications and minimal scattered thrombus within thoracic aorta.  Fusiform aneurysmal dilatation of the ascending thoracic aorta 4.5 x 4.6 cm image 47.  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  Mild fusiform aneurysm dilatation of the descending thoracic aorta at hiatus, 4.1 x 4.0 cm image 86.  No aortic dissection.  Pulmonary arteries well opacified and patent.  No evidence of pulmonary embolism.  Few scattered coronary arterial calcifications.  Small right pleural effusion.  Bibasilar atelectasis.  Lobulated soft tissue mass in anterior segment of right upper lobe, depressing the minor fissure.  Mass measures 6.2 x 4.8 x 4.9 cm and is most consistent with a primary pulmonary neoplasm.  Mass abuts the parietal pleura in the anterolateral right chest over a broad surface.  Small left upper lobe  pulmonary nodule 11 x 9 mm image 24.  Questionable 8 mm right lower lobe nodule versus volume averaging artifact image 77.  Significant thoracic adenopathy.  Enlarged precarinal lymph node 3.36 cm short axis image 39.  Enlarged right paratracheal node 3.7 cm short axis image 30.  Enlarged subcarinal node 2.6 cm short axis image 48.  Mildly enlarged right hilar node 1.6 cm short axis image 55.  AP window lymph node 1.6 cm short axis image 39.  Questionable tiny right adrenal nodule on last image.  Left retroperitoneal nodule 2.4 x 2.2 cm on last image, could potentially be of adrenal origin, adrenal gland not imaged.  No definite osseous metastases.  Review of the MIP images confirms the above findings.  IMPRESSION: No evidence of pulmonary embolism.  Fusiform aneurysmal dilatation of the ascending and descending thoracic aorta as above.  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  Additional left upper lobe and questionable right lower lobe nodules question metastatic lesions.  Extensive mediastinal and right hilar adenopathy.  Right pleural effusion.  Cannot exclude bilateral adrenal nodules.   Electronically Signed   By: Lavonia Dana M.D.   On: 08/19/2013 13:25   Mr Jeri Cos GB Contrast  09/01/2013   CLINICAL DATA:  Lung cancer.  EXAM: MRI HEAD WITHOUT AND WITH CONTRAST  TECHNIQUE: Multiplanar, multiecho pulse sequences of the brain and surrounding structures were obtained without and with intravenous contrast.  CONTRAST:  37mL MULTIHANCE GADOBENATE DIMEGLUMINE 529 MG/ML IV SOLN  COMPARISON:  CT head without contrast 12/23/2007  FINDINGS: A dural based lesion along the medial aspect of the right occipital lobe measures 10 x 17 x 20 mm. This has a dural tail and slight restricted diffusion compatible with a meningioma. A much smaller meningioma on the left side of the dura at  the same level measures 6 mm in maximal dimension. No other pathologic enhancement is evident to suggest metastatic disease of the brain or meninges.  Atrophy and diffuse white matter disease is present bilaterally. The ventricles are proportionate to the degree of atrophy. White matter changes extend into the brainstem and cerebellum. No acute infarct, hemorrhage, or parenchymal mass lesion is present. Ventricles are of normal size. No significant extra-axial fluid collection is present.  Flow is present in the major intracranial arteries. Ectasia  is noted within the intracranial internal carotid arteries bilaterally, right greater than left.  The patient is status post bilateral lens replacements. The globes and orbits are otherwise intact.  Mild mucosal thickening is present in the right sphenoid and frontal sinus. The remaining paranasal sinuses and the mastoid air cells are clear.  IMPRESSION: 1. Dural-based lesions along the posterior intra cerebral falx are most compatible with meningiomas. 2. No evidence for parenchymal metastases. 3. Moderate atrophy and diffuse white matter disease is advanced for age. This likely reflects the sequela of chronic microvascular ischemia. 4. Ectasia of the internal carotid arteries bilaterally, right greater than left. 5. Mild sinus disease in the right frontal and sphenoid sinuses.   Electronically Signed   By: Lawrence Santiago M.D.   On: 09/01/2013 15:54   Nm Pet Image Initial (pi) Skull Base To Thigh  09/06/2013   CLINICAL DATA:  Initial treatment strategy for Lung cancer.  EXAM: NUCLEAR MEDICINE PET SKULL BASE TO THIGH  FASTING BLOOD GLUCOSE:  Value: 99 mg/dl  TECHNIQUE: Eight mCi F-18 FDG was injected intravenously. Full-ring PET imaging was performed from the skull base to thigh after the radiotracer. CT data was obtained and used for attenuation correction and anatomic localization.  COMPARISON:  None.  FINDINGS: NECK  No hypermetabolic lymph nodes in the neck.  CHEST   The large right upper lobe mass shows evidence of central cavitation. This lesion is markedly hypermetabolic with SUV max = 19. There is associated hypermetabolic right supraclavicular, hilar and mediastinal lymphadenopathy.  The hypermetabolic metastasis to the right hilum shows SUV max = 7.  Index hypermetabolic prevascular lymph node has SUV max = 15.  A  Nodal conglomeration in the AP window demonstrates SUV max = 10.  10 mm nodule in the peripheral left upper lobe (image 54) is hypermetabolic with SUV max = 9.  Moderate right pleural effusion again noted. Saccular aneurysm of the transverse aorta is stable in appearance.  ABDOMEN/PELVIS  No abnormal hypermetabolic activity within the liver, pancreas, adrenal glands, or spleen. No hypermetabolic lymph nodes in the abdomen or pelvis.  Multiple renal lesions are noted bilaterally, none of which appear hypermetabolic on PET imaging. 3.5 cm fatty lesion in the left adrenal gland is stable since 06/17/2008 and compatible with angiomyolipoma. Abdominal aorta measures up to 3.1 cm in maximum dimension, consistent with aneurysm. This is stable since the 2009 exam.  SKELETON  No focal hypermetabolic activity to suggest skeletal metastasis.  IMPRESSION: Malignant range uptake in the right upper lobe mass with evidence of central necrosis. This is associated with metastatic lymphadenopathy in the right supraclavicular region, mediastinum, and right hilum. There is a hypermetabolic nodule in the left upper lobe which may be related to metastatic disease or synchronous primary neoplasm.   Electronically Signed   By: Misty Stanley M.D.   On: 09/06/2013 16:25   Ct Biopsy  08/30/2013   CLINICAL DATA:  79 year old male with right upper lobe mass concerning for primary bronchogenic carcinoma. CT-guided core biopsy is warranted for tissue diagnosis.  EXAM: CT BIOPSY  Date: 08/30/2013  TECHNIQUE: Informed consent was obtained from the patient following explanation of the  procedure, risks, benefits and alternatives. The patient understands, agrees and consents for the procedure. All questions were addressed. A time out was performed.  A planning axial CT scan was performed. The right upper lobe pulmonary mass was successfully identified. A suitable skin entry site overlying and anterior rib was selected and marked. The right chest was then  sterilely prepped and draped in the standard fashion. Local anesthesia was attained by infiltration with 1% lidocaine. Using intermittent CT guidance, a 17 gauge trocar needle was carefully advanced through the right chest wall and into the margin of the mass. Multiple 18 gauge core biopsies were then coaxially obtained using the bio Pince automated biopsy device.  Biopsy specimens were placed in formalin and delivered to pathology for further analysis. The trocar needle was removed. Hemostasis was attained with gentle manual pressure. A post biopsy axial CT scan demonstrates no evidence of postprocedural pneumothorax or significant hemorrhage. The patient tolerated the procedure well. There was no immediate complication.  ANESTHESIA/SEDATION: Moderate (conscious) sedation was used. Two mg Versed, 150 mcg Fentanyl were administered intravenously. The patient's vital signs were monitored continuously by radiology nursing throughout the procedure.  Sedation Time: 20 minutes  PROCEDURE: 1. CT-guided biopsy of right upper lobe pulmonary mass Interventional Radiologist:  Criselda Peaches, MD  IMPRESSION: Technically successful core biopsy of right upper lobe pulmonary mass.  Signed,  Criselda Peaches, MD  [facility]  [facility]   Electronically Signed   By: Jacqulynn Cadet M.D.   On: 08/30/2013 12:26   Dg Chest Port 1 View  08/30/2013   CLINICAL DATA:  Post lung biopsy  EXAM: PORTABLE CHEST - 1 VIEW  COMPARISON:  08/19/2013 and CT scan same day  FINDINGS: Cardiomediastinal silhouette is stable.  Nodular mass in right upper lobe again noted. No acute infiltrate or pulmonary edema. No diagnostic pneumothorax. Small bilateral pleural effusion.  IMPRESSION: Again noted nodular mass in right upper lobe. No diagnostic pneumothorax.   Electronically Signed   By: Lahoma Crocker M.D.   On: 08/30/2013 14:30    ASSESSMENT AND PLAN: This is a very pleasant 79 years old white male recently diagnosed with extensive stage small cell lung cancer presenting with right upper lobe lung mass in addition to mediastinal and right supraclavicular lymphadenopathy and left lung nodule as well as right pleural effusion diagnosed in February 2015. I have a lengthy discussion with the patient and his family today about his current disease stage, prognosis and treatment options. I gave the patient the option of palliative care and hospice referral versus consideration of palliative systemic chemotherapy with cisplatin and etoposide as a standard treatment versus treatment with cisplatin and etoposide plus/minus Veliparib on ECoG clinical trial E 2511. I discussed with the patient adverse effect of the chemotherapy including but not limited to alopecia, myelosuppression, nausea and vomiting, peripheral neuropathy, liver or renal dysfunction. The patient is interested in the clinical trial and he will be seen later today by the clinical research nurse for evaluation of his eligibility. The patient is expected to start the first cycle of this treatment next week. I will arrange for him to have a chemotherapy education class before the first cycle of his treatment. I will call his pharmacy with prescription for Compazine 10 mg by mouth every 6 hours as needed for nausea. He would come back for followup visit in 2 weeks or sooner if required by the clinical trial protocol. I gave the patient and his family the time to ask questions and I answered them completely to their satisfaction. He was advised to call immediately if he has any  concerning symptoms in the interval. The patient voices understanding of current disease status and treatment options and is in agreement with the current care plan.  All questions were answered. The patient knows to call the clinic  with any problems, questions or concerns. We can certainly see the patient much sooner if necessary.  I spent 20 minutes counseling the patient face to face. The total time spent in the appointment was 30 minutes.  Disclaimer: This note was dictated with voice recognition software. Similar sounding words can inadvertently be transcribed and may not be corrected upon review.

## 2013-09-06 NOTE — Patient Instructions (Signed)
Smoking Cessation Quitting smoking is important to your health and has many advantages. However, it is not always easy to quit since nicotine is a very addictive drug. Often times, people try 3 times or more before being able to quit. This document explains the best ways for you to prepare to quit smoking. Quitting takes hard work and a lot of effort, but you can do it. ADVANTAGES OF QUITTING SMOKING  You will live longer, feel better, and live better.  Your body will feel the impact of quitting smoking almost immediately.  Within 20 minutes, blood pressure decreases. Your pulse returns to its normal level.  After 8 hours, carbon monoxide levels in the blood return to normal. Your oxygen level increases.  After 24 hours, the chance of having a heart attack starts to decrease. Your breath, hair, and body stop smelling like smoke.  After 48 hours, damaged nerve endings begin to recover. Your sense of taste and smell improve.  After 72 hours, the body is virtually free of nicotine. Your bronchial tubes relax and breathing becomes easier.  After 2 to 12 weeks, lungs can hold more air. Exercise becomes easier and circulation improves.  The risk of having a heart attack, stroke, cancer, or lung disease is greatly reduced.  After 1 year, the risk of coronary heart disease is cut in half.  After 5 years, the risk of stroke falls to the same as a nonsmoker.  After 10 years, the risk of lung cancer is cut in half and the risk of other cancers decreases significantly.  After 15 years, the risk of coronary heart disease drops, usually to the level of a nonsmoker.  If you are pregnant, quitting smoking will improve your chances of having a healthy baby.  The people you live with, especially any children, will be healthier.  You will have extra money to spend on things other than cigarettes. QUESTIONS TO THINK ABOUT BEFORE ATTEMPTING TO QUIT You may want to talk about your answers with your  caregiver.  Why do you want to quit?  If you tried to quit in the past, what helped and what did not?  What will be the most difficult situations for you after you quit? How will you plan to handle them?  Who can help you through the tough times? Your family? Friends? A caregiver?  What pleasures do you get from smoking? What ways can you still get pleasure if you quit? Here are some questions to ask your caregiver:  How can you help me to be successful at quitting?  What medicine do you think would be best for me and how should I take it?  What should I do if I need more help?  What is smoking withdrawal like? How can I get information on withdrawal? GET READY  Set a quit date.  Change your environment by getting rid of all cigarettes, ashtrays, matches, and lighters in your home, car, or work. Do not let people smoke in your home.  Review your past attempts to quit. Think about what worked and what did not. GET SUPPORT AND ENCOURAGEMENT You have a better chance of being successful if you have help. You can get support in many ways.  Tell your family, friends, and co-workers that you are going to quit and need their support. Ask them not to smoke around you.  Get individual, group, or telephone counseling and support. Programs are available at local hospitals and health centers. Call your local health department for   information about programs in your area.  Spiritual beliefs and practices may help some smokers quit.  Download a "quit meter" on your computer to keep track of quit statistics, such as how long you have gone without smoking, cigarettes not smoked, and money saved.  Get a self-help book about quitting smoking and staying off of tobacco. LEARN NEW SKILLS AND BEHAVIORS  Distract yourself from urges to smoke. Talk to someone, go for a walk, or occupy your time with a task.  Change your normal routine. Take a different route to work. Drink tea instead of coffee.  Eat breakfast in a different place.  Reduce your stress. Take a hot bath, exercise, or read a book.  Plan something enjoyable to do every day. Reward yourself for not smoking.  Explore interactive web-based programs that specialize in helping you quit. GET MEDICINE AND USE IT CORRECTLY Medicines can help you stop smoking and decrease the urge to smoke. Combining medicine with the above behavioral methods and support can greatly increase your chances of successfully quitting smoking.  Nicotine replacement therapy helps deliver nicotine to your body without the negative effects and risks of smoking. Nicotine replacement therapy includes nicotine gum, lozenges, inhalers, nasal sprays, and skin patches. Some may be available over-the-counter and others require a prescription.  Antidepressant medicine helps people abstain from smoking, but how this works is unknown. This medicine is available by prescription.  Nicotinic receptor partial agonist medicine simulates the effect of nicotine in your brain. This medicine is available by prescription. Ask your caregiver for advice about which medicines to use and how to use them based on your health history. Your caregiver will tell you what side effects to look out for if you choose to be on a medicine or therapy. Carefully read the information on the package. Do not use any other product containing nicotine while using a nicotine replacement product.  RELAPSE OR DIFFICULT SITUATIONS Most relapses occur within the first 3 months after quitting. Do not be discouraged if you start smoking again. Remember, most people try several times before finally quitting. You may have symptoms of withdrawal because your body is used to nicotine. You may crave cigarettes, be irritable, feel very hungry, cough often, get headaches, or have difficulty concentrating. The withdrawal symptoms are only temporary. They are strongest when you first quit, but they will go away within  10 14 days. To reduce the chances of relapse, try to:  Avoid drinking alcohol. Drinking lowers your chances of successfully quitting.  Reduce the amount of caffeine you consume. Once you quit smoking, the amount of caffeine in your body increases and can give you symptoms, such as a rapid heartbeat, sweating, and anxiety.  Avoid smokers because they can make you want to smoke.  Do not let weight gain distract you. Many smokers will gain weight when they quit, usually less than 10 pounds. Eat a healthy diet and stay active. You can always lose the weight gained after you quit.  Find ways to improve your mood other than smoking. FOR MORE INFORMATION  www.smokefree.gov  Document Released: 06/18/2001 Document Revised: 12/24/2011 Document Reviewed: 10/03/2011 [employer] Patient Information 2014 [employer].  

## 2013-09-07 ENCOUNTER — Telehealth: Payer: Self-pay | Admitting: *Deleted

## 2013-09-07 ENCOUNTER — Other Ambulatory Visit: Payer: Self-pay | Admitting: *Deleted

## 2013-09-07 ENCOUNTER — Encounter: Payer: Self-pay | Admitting: *Deleted

## 2013-09-07 DIAGNOSIS — C3491 Malignant neoplasm of unspecified part of right bronchus or lung: Secondary | ICD-10-CM

## 2013-09-07 NOTE — Telephone Encounter (Signed)
Per staff message and POF I have scheduled appts. I have moved appt from 3/9 to 3/10 due to no available   JMW

## 2013-09-07 NOTE — [facility] Oncology Navigator Note (Unsigned)
Received phone call from patients daughter.  Patient has requested he receive treatment in [place] at [place].  I spoke with Dr. Julien Nordmann and he is aware.  Referral ordered.  Called [facility] at [place] to get fax and notify them of referral.  I faxed information.  I spoke with our HIM department and they will follow up regarding appt.

## 2013-09-08 ENCOUNTER — Telehealth: Payer: Self-pay | Admitting: Internal Medicine

## 2013-09-08 ENCOUNTER — Ambulatory Visit: Payer: Medicare Other | Admitting: Internal Medicine

## 2013-09-08 ENCOUNTER — Other Ambulatory Visit: Payer: Medicare Other

## 2013-09-08 NOTE — Telephone Encounter (Signed)
Talked to pt and she is aware of all aoot for lab,Md and chemo for March

## 2013-09-09 ENCOUNTER — Encounter: Payer: Self-pay | Admitting: *Deleted

## 2013-09-10 ENCOUNTER — Telehealth: Payer: Self-pay | Admitting: *Deleted

## 2013-09-10 ENCOUNTER — Other Ambulatory Visit: Payer: Medicare Other

## 2013-09-10 NOTE — Telephone Encounter (Signed)
Late Entry 09/09/13:  Spoke with pt's daughter Lynelle Smoke, pt will be trasnfering care to [facility].  All appointments cancelled from [facility].  Asked daughter to call back if anything changes.  She verbalized understanding.   Tammy ph # [phone]   YNW

## 2013-09-13 ENCOUNTER — Ambulatory Visit: Payer: Medicare Other | Admitting: Physician Assistant

## 2013-09-13 ENCOUNTER — Other Ambulatory Visit: Payer: Medicare Other

## 2013-09-14 ENCOUNTER — Ambulatory Visit: Payer: Medicare Other

## 2013-09-15 ENCOUNTER — Ambulatory Visit: Payer: Medicare Other

## 2013-09-15 ENCOUNTER — Ambulatory Visit: Payer: Medicare Other | Admitting: Cardiology

## 2013-09-16 ENCOUNTER — Ambulatory Visit: Payer: Medicare Other

## 2013-09-17 ENCOUNTER — Ambulatory Visit: Payer: Medicare Other

## 2013-09-20 ENCOUNTER — Other Ambulatory Visit: Payer: Medicare Other

## 2013-09-20 ENCOUNTER — Ambulatory Visit: Payer: Medicare Other | Admitting: Internal Medicine

## 2013-09-20 ENCOUNTER — Encounter: Payer: Self-pay | Admitting: *Deleted

## 2013-09-20 NOTE — Progress Notes (Signed)
I received a call from daughter today.  She stated pt has expired.  She was thankful for the help we gave.  I will notify medical records of expired status

## 2013-09-27 ENCOUNTER — Ambulatory Visit: Payer: Medicare Other | Admitting: Internal Medicine

## 2013-10-04 ENCOUNTER — Other Ambulatory Visit: Payer: Medicare Other

## 2013-10-06 DEATH — deceased

## 2013-10-07 ENCOUNTER — Ambulatory Visit: Payer: Medicare Other

## 2014-09-14 IMAGING — DX DG CHEST 1V PORT
2 series · 2 of 2 positions shown · non-contrast
Comparison: 08/19/2013 and CT scan same day

CLINICAL DATA: Post lung biopsy

EXAM:
PORTABLE CHEST - 1 VIEW

[ap (1 of 2)]
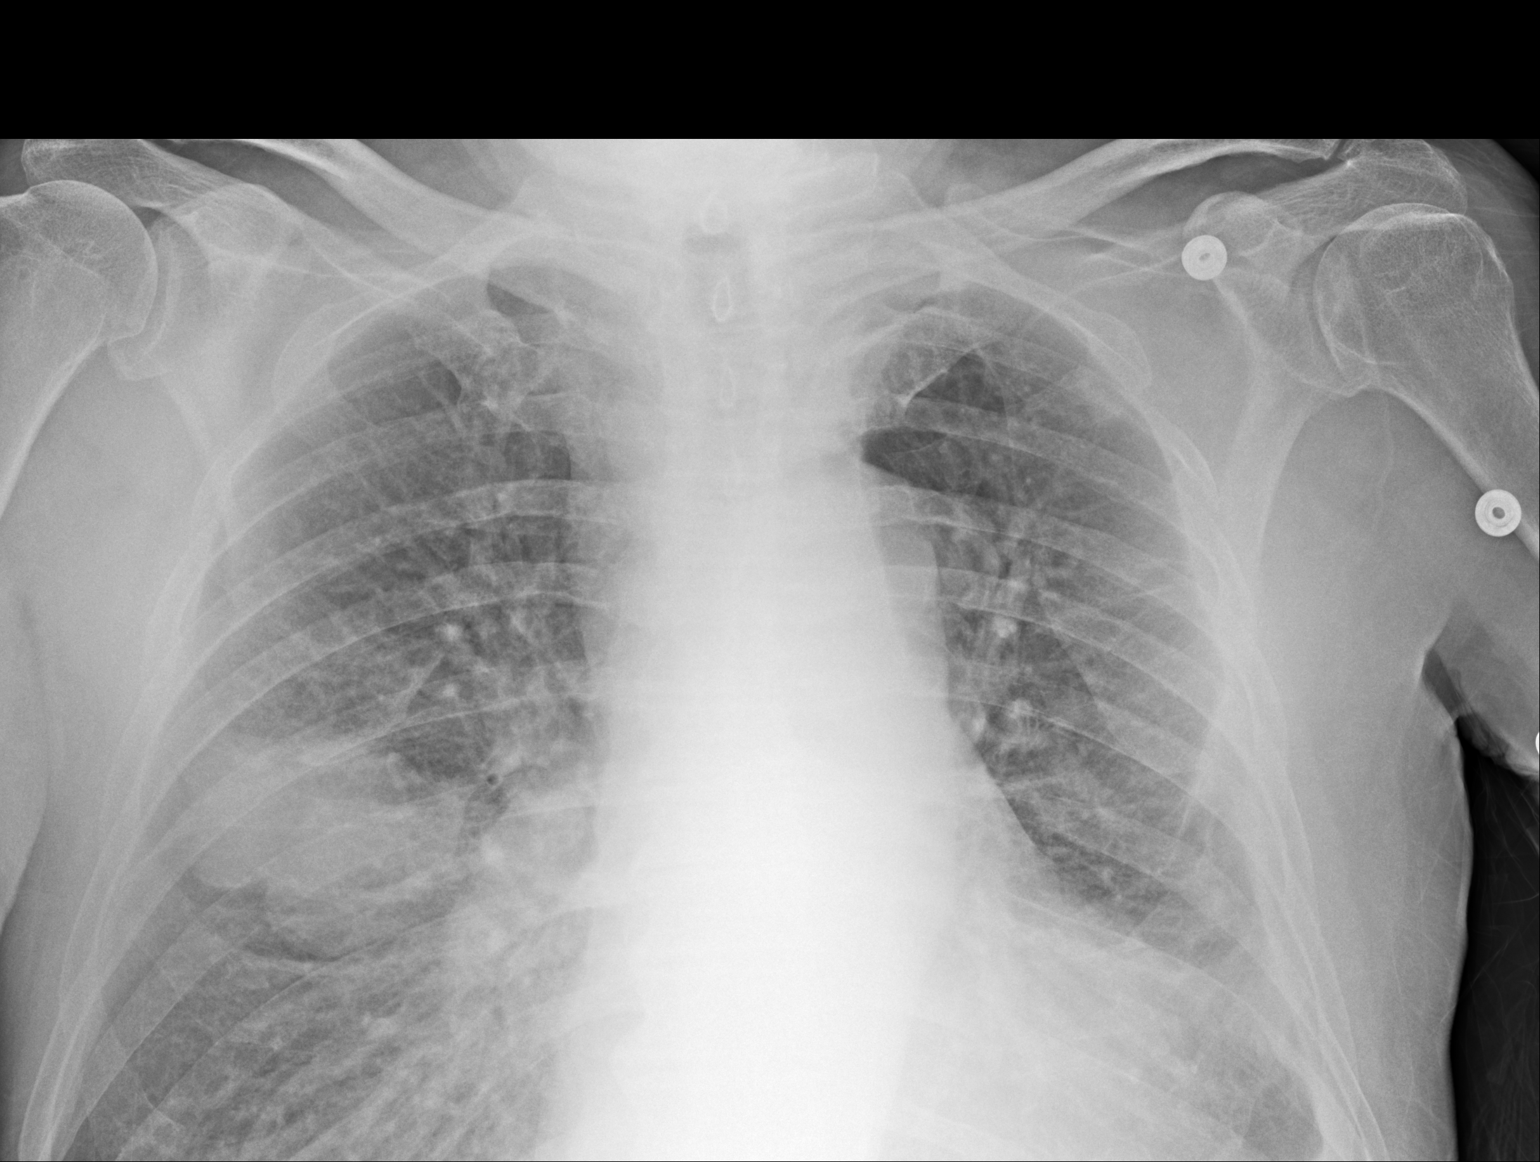

[ap (2 of 2)]
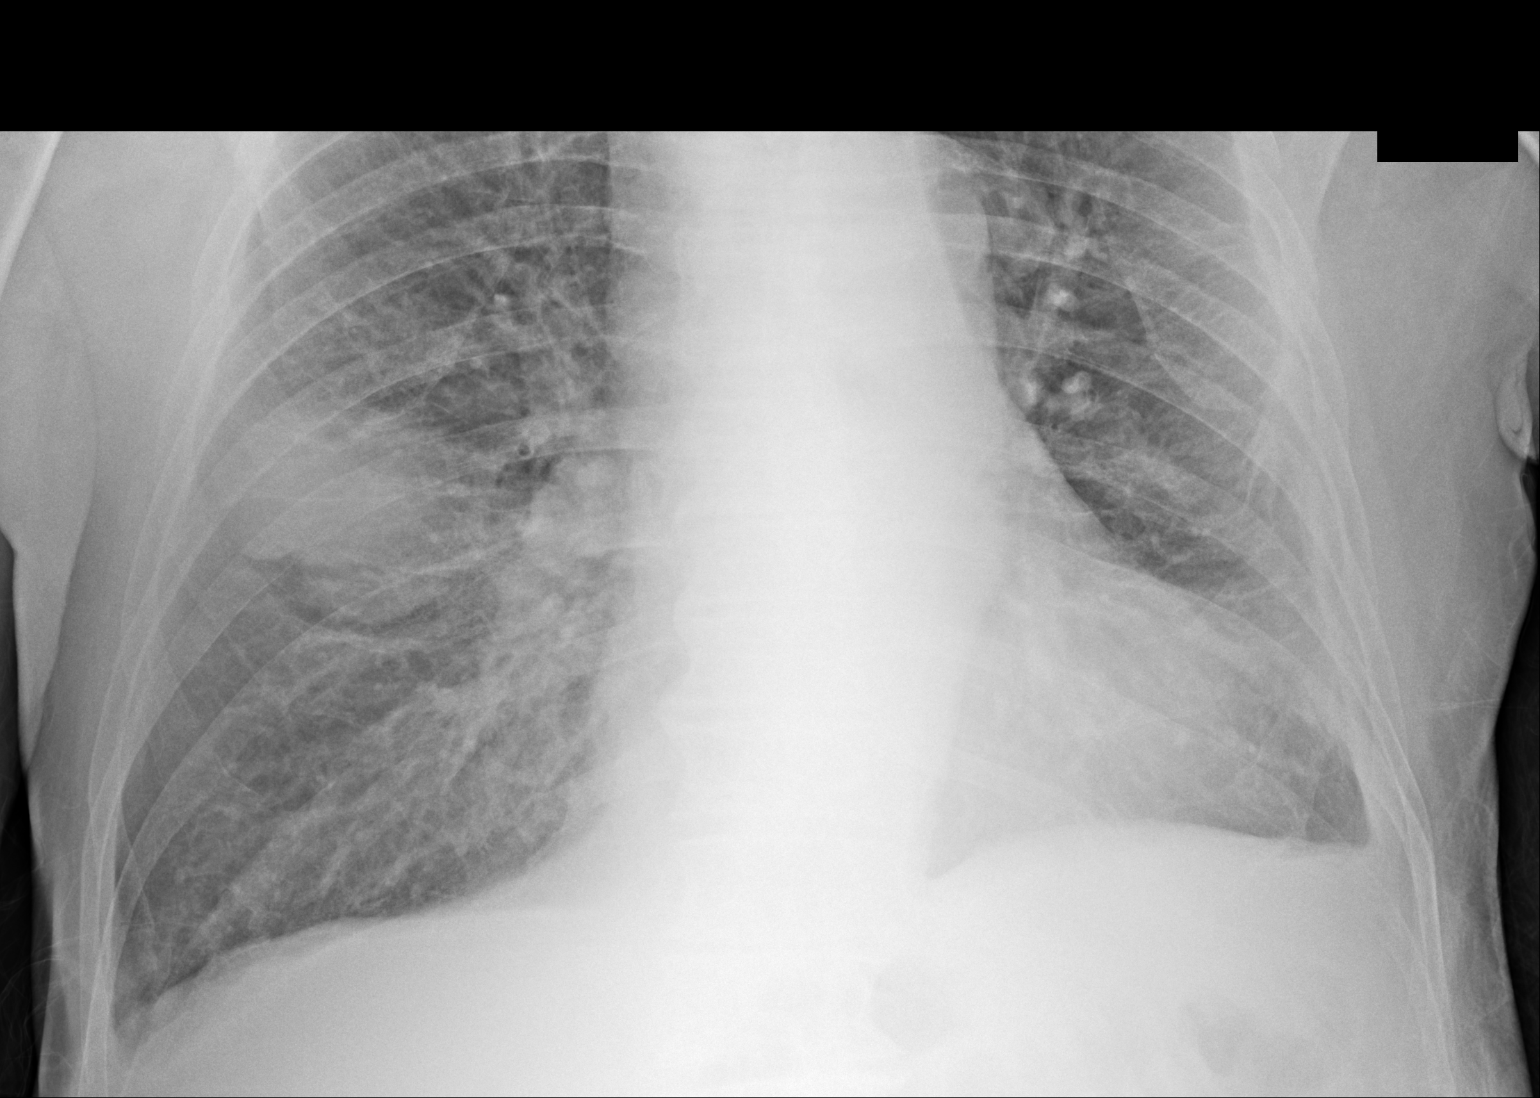

[2 of 2 positions shown; findings below may reference images not displayed]

FINDINGS: Cardiomediastinal silhouette is stable. Nodular mass in right upper
lobe again noted. No acute infiltrate or pulmonary edema. No
diagnostic pneumothorax. Small bilateral pleural effusion.
IMPRESSION: Again noted nodular mass in right upper lobe. No diagnostic
pneumothorax.

## 2014-09-21 IMAGING — CT NM PET TUM IMG INITIAL (PI) SKULL BASE T - THIGH
8 series · 25 of 25 positions shown · non-contrast
Comparison: None.

CLINICAL DATA: Initial treatment strategy for Lung cancer.

EXAM:
NUCLEAR MEDICINE PET SKULL BASE TO THIGH
FASTING BLOOD GLUCOSE:  Value: 99 mg/dl
TECHNIQUE: Eight mCi F-18 FDG was injected intravenously. Full-ring PET imaging
was performed from the skull base to thigh after the radiotracer. CT
data was obtained and used for attenuation correction and anatomic
localization.

[Series 3: pet sk_thigh ac · axial · 5.0mm · 4.07mm/px · z∈[-1060,-184]mm · 4 of 220 slices shown]
[im 1/220]
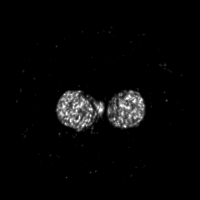
[im 74/220]
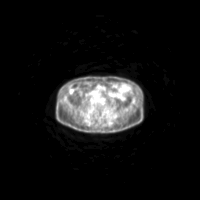
[im 147/220]
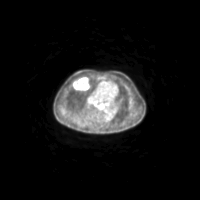
[im 220/220]
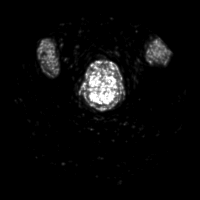

[Series 4: ct sk_thigh 5.0 b31f · axial · 5.0mm · 0.98mm/px · z∈[-1060,-184]mm · 4 of 220 slices shown]
[im 1/220]
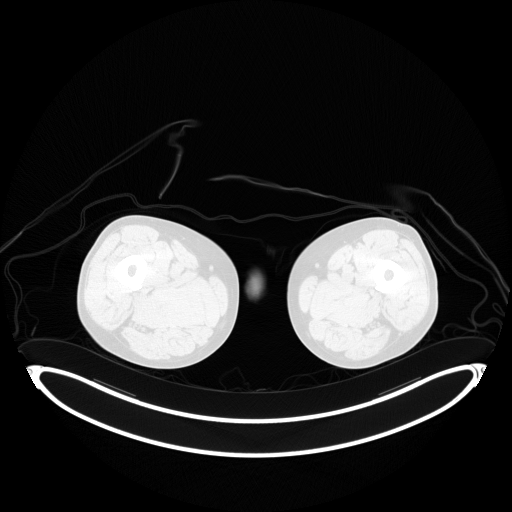
[im 74/220]
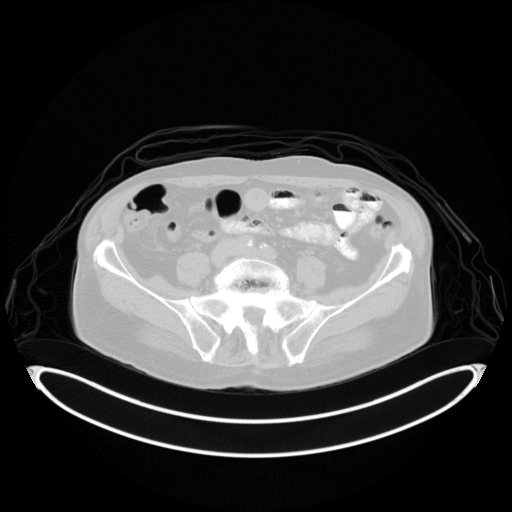
[im 147/220]
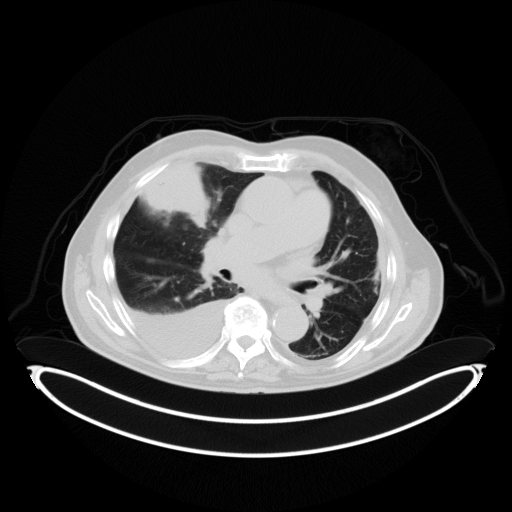
[im 220/220]
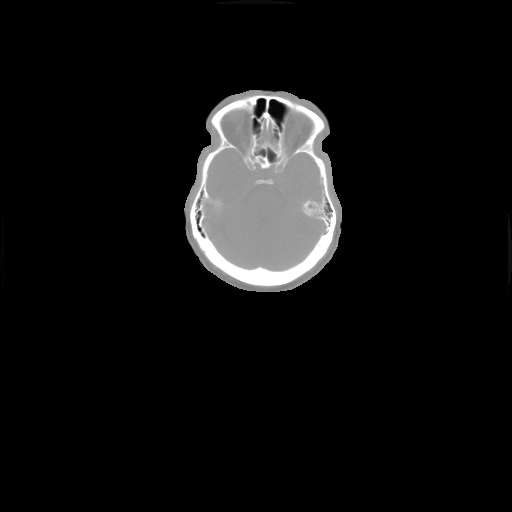

[Series 7: pet sk_thigh nac · axial · 5.0mm · 4.07mm/px · z∈[-1060,-184]mm · 4 of 220 slices shown]
[im 1/220]
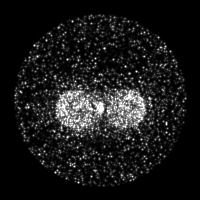
[im 74/220]
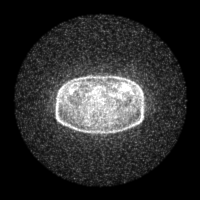
[im 147/220]
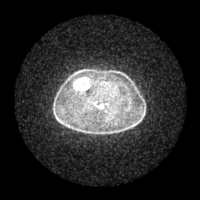
[im 220/220]
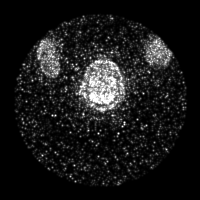

[Series 8: pet sk_thigh ac itr · axial · 5.0mm · 4.07mm/px · z∈[-1060,-184]mm · 5 of 220 slices shown]
[im 1/220]
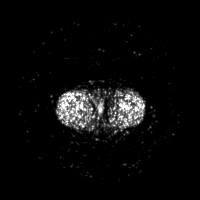
[im 55/220]
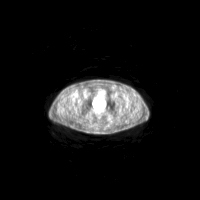
[im 110/220]
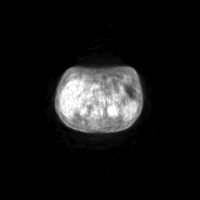
[im 165/220]
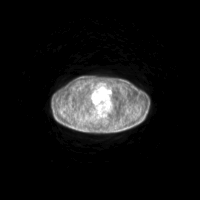
[im 220/220]
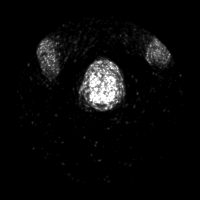

[Series 604: mip collection<mip range> · coronal · 1.82mm/px · 1 of 32 slices shown]
[im 1/32]
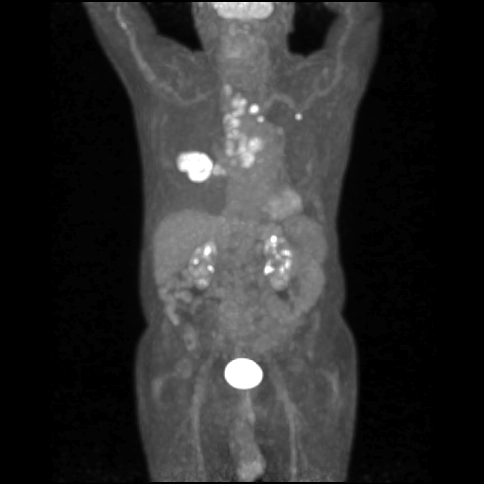

[Series 605: range-ct sk_thigh 5.0 (id)<alpha range> · 2 of 84 slices shown (1 of 2)]
[im 1/84]
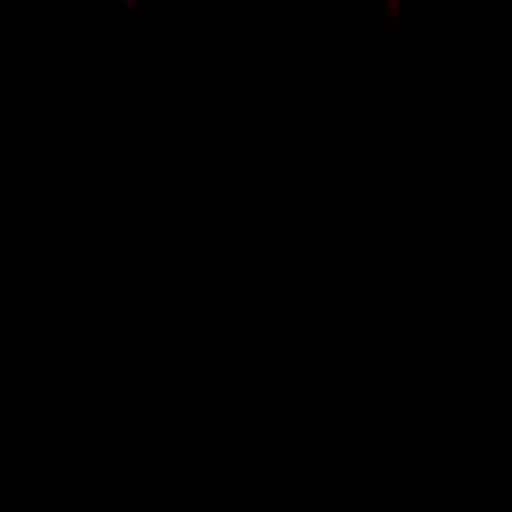
[im 84/84]
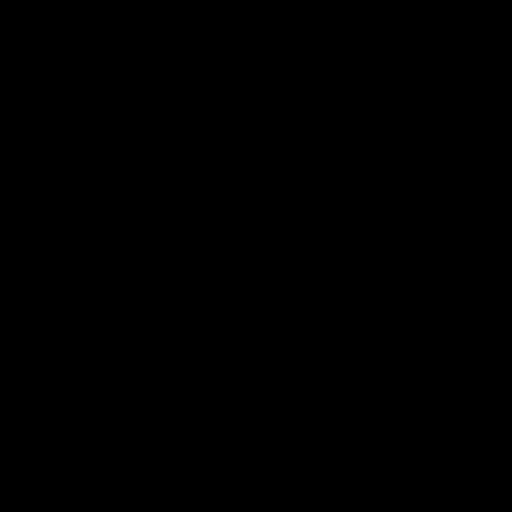

[Series 606: range-ct sk_thigh 5.0 (id)<alpha range> · 4 of 210 slices shown (2 of 2)]
[im 1/210]
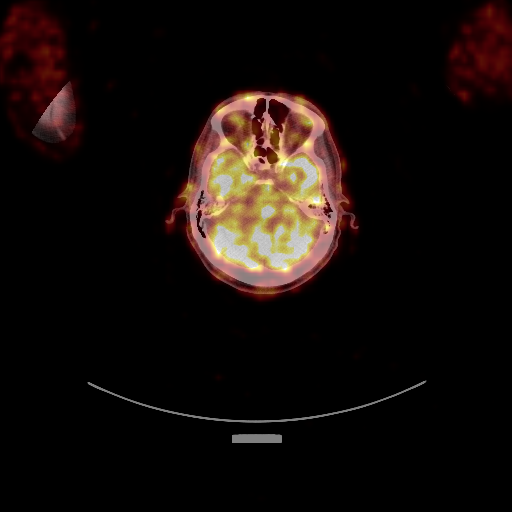
[im 70/210]
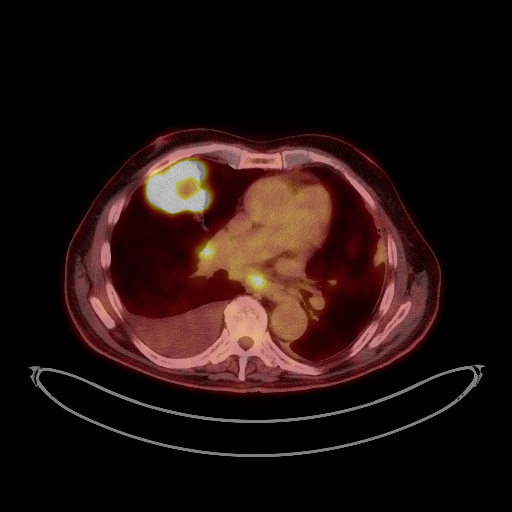
[im 140/210]
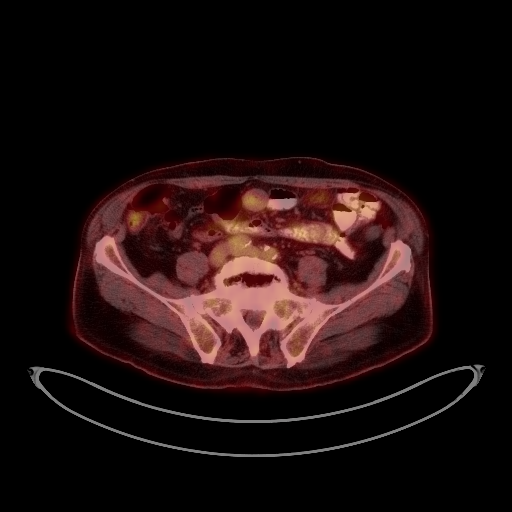
[im 210/210]
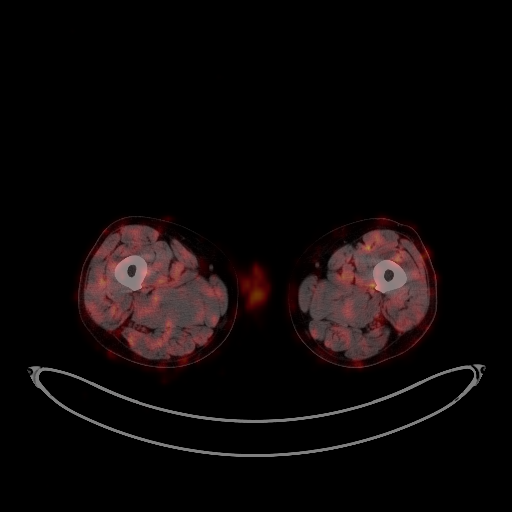

[Series 1056: results mm oncology reading · 0.95mm/px · 1 of 9 slices shown]
[im 1/9]
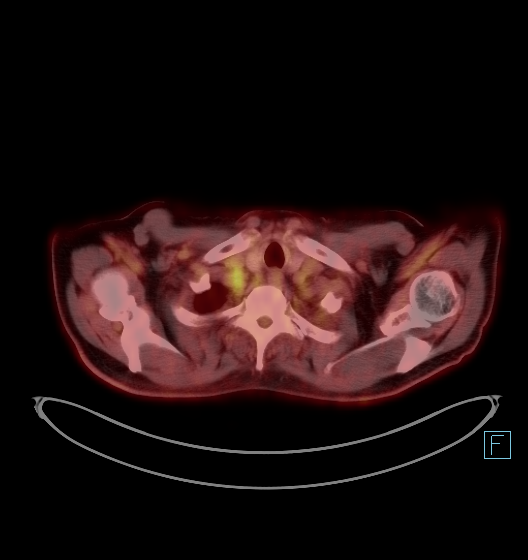

[25 of 25 positions shown; findings below may reference images not displayed]

FINDINGS: NECK

No hypermetabolic lymph nodes in the neck.

CHEST

The large right upper lobe mass shows evidence of central
cavitation. This lesion is markedly hypermetabolic with SUV max =
19. There is associated hypermetabolic right supraclavicular, hilar
and mediastinal lymphadenopathy.

The hypermetabolic metastasis to the right hilum shows SUV max = 7.

Index hypermetabolic prevascular lymph node has SUV max = 15.  A

Nodal conglomeration in the AP window demonstrates SUV max = 10.

10 mm nodule in the peripheral left upper lobe (image 54) is
hypermetabolic with SUV max = 9.

Moderate right pleural effusion again noted. Saccular aneurysm of
the transverse aorta is stable in appearance.

ABDOMEN/PELVIS

No abnormal hypermetabolic activity within the liver, pancreas,
adrenal glands, or spleen. No hypermetabolic lymph nodes in the
abdomen or pelvis.

Multiple renal lesions are noted bilaterally, none of which appear
hypermetabolic on PET imaging. 3.5 cm fatty lesion in the left
adrenal gland is stable since 06/17/2008 and compatible with
angiomyolipoma. Abdominal aorta measures up to 3.1 cm in maximum
dimension, consistent with aneurysm. This is stable since the 6552
exam.

SKELETON

No focal hypermetabolic activity to suggest skeletal metastasis.
IMPRESSION: Malignant range uptake in the right upper lobe mass with evidence of
central necrosis. This is associated with metastatic lymphadenopathy
in the right supraclavicular region, mediastinum, and right hilum.
There is a hypermetabolic nodule in the left upper lobe which may be
related to metastatic disease or synchronous primary neoplasm.

## 2015-12-25 ENCOUNTER — Other Ambulatory Visit: Payer: Self-pay | Admitting: Nurse Practitioner
